# Patient Record
Sex: Female | Born: 1945
Health system: Southern US, Community
[De-identification: ages and names within clinical notes are randomized; demographics above are authoritative.]

## PROBLEM LIST (undated history)

## (undated) DIAGNOSIS — I10 Essential (primary) hypertension: Secondary | ICD-10-CM

## (undated) DIAGNOSIS — E785 Hyperlipidemia, unspecified: Secondary | ICD-10-CM

## (undated) DIAGNOSIS — E119 Type 2 diabetes mellitus without complications: Secondary | ICD-10-CM

## (undated) HISTORY — PX: CHOLECYSTECTOMY: SHX55

## (undated) HISTORY — DX: Type 2 diabetes mellitus without complications: E11.9

## (undated) HISTORY — DX: Essential (primary) hypertension: I10

## (undated) HISTORY — DX: Hyperlipidemia, unspecified: E78.5

---

## 1998-08-27 ENCOUNTER — Ambulatory Visit (HOSPITAL_COMMUNITY): Admission: RE | Admit: 1998-08-27 | Discharge: 1998-08-27 | Payer: Self-pay | Admitting: Obstetrics & Gynecology

## 1999-04-13 ENCOUNTER — Ambulatory Visit: Admission: RE | Admit: 1999-04-13 | Discharge: 1999-04-13 | Payer: Self-pay | Admitting: Family Medicine

## 2000-05-15 ENCOUNTER — Other Ambulatory Visit: Admission: RE | Admit: 2000-05-15 | Discharge: 2000-05-15 | Payer: Self-pay | Admitting: Obstetrics & Gynecology

## 2000-05-21 ENCOUNTER — Other Ambulatory Visit: Admission: RE | Admit: 2000-05-21 | Discharge: 2000-05-21 | Payer: Self-pay | Admitting: Obstetrics & Gynecology

## 2000-05-21 ENCOUNTER — Encounter (INDEPENDENT_AMBULATORY_CARE_PROVIDER_SITE_OTHER): Payer: Self-pay

## 2000-07-07 ENCOUNTER — Encounter: Admission: RE | Admit: 2000-07-07 | Discharge: 2000-07-07 | Payer: Self-pay | Admitting: Obstetrics & Gynecology

## 2000-07-07 ENCOUNTER — Encounter: Payer: Self-pay | Admitting: Obstetrics & Gynecology

## 2001-05-17 ENCOUNTER — Other Ambulatory Visit: Admission: RE | Admit: 2001-05-17 | Discharge: 2001-05-17 | Payer: Self-pay | Admitting: Obstetrics and Gynecology

## 2001-06-12 ENCOUNTER — Emergency Department (HOSPITAL_COMMUNITY): Admission: EM | Admit: 2001-06-12 | Discharge: 2001-06-12 | Payer: Self-pay

## 2001-07-12 ENCOUNTER — Encounter: Payer: Self-pay | Admitting: Obstetrics and Gynecology

## 2001-07-12 ENCOUNTER — Encounter: Admission: RE | Admit: 2001-07-12 | Discharge: 2001-07-12 | Payer: Self-pay | Admitting: Obstetrics and Gynecology

## 2001-07-16 ENCOUNTER — Encounter: Admission: RE | Admit: 2001-07-16 | Discharge: 2001-07-16 | Payer: Self-pay | Admitting: Obstetrics and Gynecology

## 2001-07-16 ENCOUNTER — Encounter: Payer: Self-pay | Admitting: Obstetrics and Gynecology

## 2001-07-30 ENCOUNTER — Observation Stay (HOSPITAL_COMMUNITY): Admission: RE | Admit: 2001-07-30 | Discharge: 2001-07-31 | Payer: Self-pay | Admitting: Surgery

## 2001-07-30 ENCOUNTER — Encounter: Payer: Self-pay | Admitting: Surgery

## 2001-07-30 ENCOUNTER — Encounter (INDEPENDENT_AMBULATORY_CARE_PROVIDER_SITE_OTHER): Payer: Self-pay | Admitting: *Deleted

## 2002-01-11 ENCOUNTER — Encounter: Admission: RE | Admit: 2002-01-11 | Discharge: 2002-01-11 | Payer: Self-pay | Admitting: Obstetrics and Gynecology

## 2002-01-11 ENCOUNTER — Encounter: Payer: Self-pay | Admitting: Obstetrics and Gynecology

## 2002-05-17 ENCOUNTER — Other Ambulatory Visit: Admission: RE | Admit: 2002-05-17 | Discharge: 2002-05-17 | Payer: Self-pay | Admitting: Obstetrics and Gynecology

## 2002-08-02 ENCOUNTER — Encounter: Admission: RE | Admit: 2002-08-02 | Discharge: 2002-08-02 | Payer: Self-pay | Admitting: Obstetrics and Gynecology

## 2002-08-02 ENCOUNTER — Encounter: Payer: Self-pay | Admitting: Obstetrics and Gynecology

## 2003-05-10 ENCOUNTER — Other Ambulatory Visit: Admission: RE | Admit: 2003-05-10 | Discharge: 2003-05-10 | Payer: Self-pay | Admitting: Obstetrics and Gynecology

## 2003-08-01 ENCOUNTER — Encounter (INDEPENDENT_AMBULATORY_CARE_PROVIDER_SITE_OTHER): Payer: Self-pay | Admitting: Specialist

## 2003-08-01 ENCOUNTER — Ambulatory Visit (HOSPITAL_COMMUNITY): Admission: RE | Admit: 2003-08-01 | Discharge: 2003-08-01 | Payer: Self-pay | Admitting: Gastroenterology

## 2003-08-16 ENCOUNTER — Encounter: Admission: RE | Admit: 2003-08-16 | Discharge: 2003-08-16 | Payer: Self-pay | Admitting: Obstetrics and Gynecology

## 2003-08-16 ENCOUNTER — Encounter: Payer: Self-pay | Admitting: Obstetrics and Gynecology

## 2004-08-21 ENCOUNTER — Other Ambulatory Visit: Admission: RE | Admit: 2004-08-21 | Discharge: 2004-08-21 | Payer: Self-pay | Admitting: Obstetrics and Gynecology

## 2005-01-27 ENCOUNTER — Encounter: Admission: RE | Admit: 2005-01-27 | Discharge: 2005-01-27 | Payer: Self-pay | Admitting: Family Medicine

## 2005-08-29 ENCOUNTER — Other Ambulatory Visit: Admission: RE | Admit: 2005-08-29 | Discharge: 2005-08-29 | Payer: Self-pay | Admitting: Obstetrics and Gynecology

## 2005-09-09 ENCOUNTER — Encounter: Admission: RE | Admit: 2005-09-09 | Discharge: 2005-09-09 | Payer: Self-pay | Admitting: Obstetrics and Gynecology

## 2006-01-28 ENCOUNTER — Encounter: Admission: RE | Admit: 2006-01-28 | Discharge: 2006-01-28 | Payer: Self-pay | Admitting: Family Medicine

## 2008-04-21 ENCOUNTER — Encounter: Admission: RE | Admit: 2008-04-21 | Discharge: 2008-04-21 | Payer: Self-pay | Admitting: Family Medicine

## 2009-01-04 ENCOUNTER — Encounter: Admission: RE | Admit: 2009-01-04 | Discharge: 2009-01-04 | Payer: Self-pay | Admitting: Family Medicine

## 2009-02-06 ENCOUNTER — Encounter (INDEPENDENT_AMBULATORY_CARE_PROVIDER_SITE_OTHER): Payer: Self-pay | Admitting: Interventional Radiology

## 2009-02-06 ENCOUNTER — Other Ambulatory Visit: Admission: RE | Admit: 2009-02-06 | Discharge: 2009-02-06 | Payer: Self-pay | Admitting: Interventional Radiology

## 2009-02-06 ENCOUNTER — Encounter: Admission: RE | Admit: 2009-02-06 | Discharge: 2009-02-06 | Payer: Self-pay | Admitting: Internal Medicine

## 2010-02-07 ENCOUNTER — Encounter: Admission: RE | Admit: 2010-02-07 | Discharge: 2010-02-07 | Payer: Self-pay | Admitting: Internal Medicine

## 2010-12-28 ENCOUNTER — Encounter: Payer: Self-pay | Admitting: Obstetrics and Gynecology

## 2011-04-25 NOTE — Op Note (Signed)
[place]  Patient:    Tracy [name], Tracy [name] Visit Number: [id number] MRN: [id number]          Service Type: SUR Location: [id number] Attending Physician:  Andre Lefort Dictated by:   Sandria Bales. Ezzard Standing, M.D. Proc. Date: 07/30/01 Adm. Date:  07/30/2001 Disc. Date: 07/31/2001   CC:         Desma Maxim, M.D.   Operative Report  DATE OF BIRTH:  [birth year].  CCS NUMBER:  [id number].  PREOPERATIVE DIAGNOSIS:  Chronic cholecystitis, cholelithiasis.  POSTOPERATIVE DIAGNOSIS:  Chronic cholecystitis, cholelithiasis.  OPERATION:  Laparoscopic cholecystectomy with intraoperative cholangiogram.  SURGEON:  Sandria Bales. Ezzard Standing, M.D.  ASSISTANT:  Donnie Coffin. Samuella Cota, M.D.  ANESTHESIA:  General endotracheal.  ESTIMATED BLOOD LOSS:  Minimal.  INDICATION:  Ms. Briggs is a 65 year old female who has symptomatic cholelithiasis.  Now comes for attempted laparoscopic cholecystectomy and potential indications and complications of procedure explained to the patient.  DESCRIPTION OF PROCEDURE:  The patient placed in supine position and given a general endotracheal anesthetic.  She had an orogastric tube, PS stockings in place, and given 1 gm of Ancef at the initiation of the procedure.  Her abdomen was prepped with Betadine solution and sterilely draped.  An infraumbilical incision was made with sharp dissection and carried down to the abdominal cavity.  A 0 degree laparoscope 10 mm was inserted through a 12 mm Hasson trocar.  The Hasson trocar was secured with a 0 Vicryl suture.  The abdomen was insufflated with CO2 to the pressure of about 14 cm.  Abdominal exploration revealed normal right and left local liver and anterior wall of the stomach.  The remainder of the bowel was covered with omentum. There was no nodularity, no mass, and no lesion.  Three additional trocars were placed-a 10 mm Ethicon trocar in the subxiphoid location and a 5 mm Ethicon  trocar in the right midsubcostal, and a 5 mm Ethicon trocar in the right lateral subcostal location.  The gallbladder was grasped, rotated cephalad.  Dissection carried out identifying the cystic duct, the gallbladder junction, and the cystic node of the triangle of Calot.  The cystic artery was triply endoclipped and divided. A single clip placed on the gallbladder side of the cystic duct. Intraoperative cholangiogram was obtained.  The intraoperative cholangiogram was obtained using a taut catheter inserted through a 14 gauge Jelco and inserted to the side of the cystic duct and a single clip to hold the duct in place.  I used half strength Hypaque solution. A total of about 12 cc was used under direct fluoroscopy.  The contrast looked freely down the cystic duct to the common bile duct of the hepatic radicles but did not empty into the common bile duct.  The distal common bile duct tapered normally.  I felt the patient just had spasm of the distal common bile duct.  I gave the patient 1 mg of glucagon, waited three minutes, and repeated the cholangiogram which now showed free flow of contrast into the duodenum.  This was felt to be a normal intraoperative cholangiogram.  The taut catheter was then removed.  The cystic duct was triply endoclipped and divided.  The gallbladder was then sharply and bluntly dissected from the gallbladder bed using primarily hook Bovie coagulation prior to complete division for the gallbladder and the gallbladder bed.  The gallbladder bed and triangle of Calot was visualized.  There was no bile leak and no bleeding.  The gallbladder was then divided, delivered through the umbilicus, and sent to pathology.  The umbilical port was closed with a 0 Vicryl suture.  The abdomen was irrigated.  All the ports were removed under direct visualization.  There was no bleeding and no bile leak.  The skin was then closed with a 5-0 Vicryl suture, painted with tinc  of benzoin and Steri-Strips, and sterilely dressed.  The patient was transferred to the recovery room in good condition with sponge and needle count. Dictated by:   Sandria Bales. Ezzard Standing, M.D. Attending Physician:  Andre Lefort DD:  07/30/01 TD:  08/01/01 Job: [id number]

## 2011-04-25 NOTE — Op Note (Signed)
   NAME:  [name], Tracy P                          ACCOUNT NO.:  [id number]   MEDICAL RECORD NO.:  [id number]                   PATIENT TYPE:  AMB   LOCATION:  ENDO                                 FACILITY[facility]  [facility]   PHYSICIAN:  John C. Madilyn Fireman, M.D.                 DATE OF BIRTH:  1946-07-21   DATE OF PROCEDURE:  08/01/2003  DATE OF DISCHARGE:                                 OPERATIVE REPORT   PROCEDURE:  Colonoscopy.   INDICATIONS FOR PROCEDURE:  Colon cancer screening in a 65 year old patient  with no previous screening.   DESCRIPTION OF PROCEDURE:  The patient was placed in the left lateral  decubitus position and placed on the pulse monitor, with continuous low-flow  oxygen delivered by nasal cannula.  She was sedated with 50 mcg IV fentanyl  and  4 mg IV Versed.  The Olympus video colonoscope was inserted into the rectum  and advanced to the cecum, confirmed by transillumination of McBurney's  point and visualization of the ileocecal valve and appendiceal orifice.  The  prep was excellent.  The cecum, ascending, and proximal transverse colon  appeared normal; with no masses, polyps, diverticula or other mucosal  abnormalities.  In the distal transverse colon there was a 6 cm sessile  polyp, which was fulgurated by hot biopsy.  The descending, sigmoid and  rectum appeared normal, with no further polyps, masses, diverticula or other  mucosal abnormalities.  The scope was then withdrawn and the patient  returned to the recovery room in stable condition.  She tolerated the  procedure well and there were no immediate complications.   IMPRESSION:  1. Transverse colon polyp.  2. Otherwise normal study.   PLAN:  Await histology to determine method and interval for future colon  screening.                                               John C. Madilyn Fireman, M.D.    JCH/MEDQ  D:  08/01/2003  T:  08/01/2003  Job:  [id number]   cc:   Donia Guiles, M.D.  [address] [place]  [place] [address]  Fax: [phone]

## 2013-07-07 ENCOUNTER — Encounter: Payer: Medicare Other | Attending: Family Medicine

## 2013-07-07 VITALS — Ht 63.0 in | Wt 193.1 lb

## 2013-07-07 DIAGNOSIS — Z713 Dietary counseling and surveillance: Secondary | ICD-10-CM | POA: Insufficient documentation

## 2013-07-07 DIAGNOSIS — E119 Type 2 diabetes mellitus without complications: Secondary | ICD-10-CM | POA: Insufficient documentation

## 2013-07-07 NOTE — Progress Notes (Signed)
Patient was seen on 07/07/13 for the first of a series of three diabetes self-management courses at the [facility].   Current HbA1c: 6.5%  The following learning objectives were met by the patient during this course:   Defines the role of glucose and insulin  Identifies type of diabetes and pathophysiology  Defines the diagnostic criteria for diabetes and prediabetes  States the risk factors for Type 2 Diabetes  States the symptoms of Type 2 Diabetes  Defines Type 2 Diabetes treatment goals  Defines Type 2 Diabetes treatment options  States the rationale for glucose monitoring  Identifies A1C, glucose targets, and testing times  Identifies proper sharps disposal  Defines the purpose of a diabetes food plan  Identifies carbohydrate food groups  Defines effects of carbohydrate foods on glucose levels  Identifies carbohydrate choices/grams/food labels  States benefits of physical activity and effect on glucose  Review of suggested activity guidelines  Handouts given during class include:  Type 2 Diabetes: Basics Book  My Food Plan Book  Food and Activity Log  Your patient has identified their diabetes self-care support plan as:  [facility] support group  Follow-Up Plan: Attend core 2 and core 3

## 2013-07-07 NOTE — Patient Instructions (Signed)
Goals:  Follow Diabetes Meal Plan as instructed  Eat 3 meals and 2 snacks, every 3-5 hrs  Limit carbohydrate intake to 30-45 grams carbohydrate/meal  Limit carbohydrate intake to 15 grams carbohydrate/snack  Add lean protein foods to meals/snacks  Monitor glucose levels as instructed by your doctor  Aim for 30 mins of physical activity daily  Bring food record and glucose log to your next nutrition visit 

## 2013-07-28 ENCOUNTER — Ambulatory Visit: Payer: Medicare Other

## 2013-08-11 ENCOUNTER — Ambulatory Visit: Payer: Medicare Other

## 2013-08-25 ENCOUNTER — Encounter: Payer: Medicare Other | Attending: Family Medicine

## 2013-08-25 DIAGNOSIS — E119 Type 2 diabetes mellitus without complications: Secondary | ICD-10-CM | POA: Insufficient documentation

## 2013-08-25 DIAGNOSIS — Z713 Dietary counseling and surveillance: Secondary | ICD-10-CM | POA: Insufficient documentation

## 2013-08-25 NOTE — Progress Notes (Signed)
Patient was seen on 08/25/13 for the second of a series of three diabetes self-management courses at the [facility]. The following learning objectives were met by the patient during this course:   Explain basic nutrition maintenance and quality assurance  Describe causes, symptoms and treatment of hypoglycemia and hyperglycemia  Explain how to manage diabetes during illness  Describe the importance of good nutrition for health and healthy eating strategies  List strategies to follow meal plan when dining out  Describe the effects of alcohol on glucose and how to use it safely  Describe problem solving skills for day-to-day glucose challenges  Describe strategies to use when treatment plan needs to change  Identify important factors involved in successful weight loss  Describe ways to remain physically active  Describe the impact of regular activity on insulin resistance  Identify current diabetes medications, their action on blood glucose, and [pssible side effects.  Handouts given in class:  Refrigerator magnet for Sick Day Guidelines  [facility] Oral medication/insulin handout  Your patient has identified their diabetes self-care support plan as:  [facility] support group   Follow-Up Plan: Patient will attend the final class of the ADA Diabetes Self-Care Education.   

## 2013-09-22 ENCOUNTER — Encounter: Payer: Medicare Other | Attending: Family Medicine

## 2013-09-22 DIAGNOSIS — E119 Type 2 diabetes mellitus without complications: Secondary | ICD-10-CM | POA: Insufficient documentation

## 2013-09-22 DIAGNOSIS — Z713 Dietary counseling and surveillance: Secondary | ICD-10-CM | POA: Insufficient documentation

## 2013-09-30 NOTE — Progress Notes (Signed)
  Patient was seen on 09/22/13 for the third of a series of three diabetes self-management courses at the [facility]. The following learning objectives were met by the patient during this course:    Describe how diabetes changes over time   Identify diabetes complications and ways to prevent them   Describe strategies that can promote heart health including lowering blood pressure and cholesterol   Describe strategies to lower dietary fat and sodium in the diet   Identify physical activities that benefit cardiovascular health   Evaluate success in meeting personal goal   Describe the belief that they can live successfully with diabetes day to day   Establish 2-3 goals that they will plan to diligently work on until they return for the free 73-month follow-up visit  The following handouts were given in class:  4 Month Follow Up Visit handout  Goal setting handout  Class evaluation form  Your patient has established the following 3month goals for diabetes self-care: Count carbs at most meals Read food labels Be active 30 minutes 5Xwk Test glucose 1Xdaily 7days a week  Follow-Up Plan: Patient will attend a 4 month follow-up visit for diabetes self-management education.

## 2014-01-27 ENCOUNTER — Ambulatory Visit: Payer: Medicare Other | Admitting: *Deleted

## 2014-02-14 ENCOUNTER — Ambulatory Visit: Payer: Medicare Other | Admitting: *Deleted

## 2014-03-07 ENCOUNTER — Ambulatory Visit: Payer: Medicare Other | Admitting: *Deleted

## 2014-06-27 ENCOUNTER — Other Ambulatory Visit: Payer: Self-pay | Admitting: Family Medicine

## 2014-06-27 DIAGNOSIS — Z1231 Encounter for screening mammogram for malignant neoplasm of breast: Secondary | ICD-10-CM

## 2014-07-06 ENCOUNTER — Other Ambulatory Visit: Payer: Self-pay | Admitting: Family Medicine

## 2014-07-06 ENCOUNTER — Ambulatory Visit
Admission: RE | Admit: 2014-07-06 | Discharge: 2014-07-06 | Disposition: A | Discharge status: Home / Self Care | Payer: Medicare Other | Source: Ambulatory Visit | Attending: Family Medicine | Admitting: Family Medicine

## 2014-07-06 ENCOUNTER — Encounter (INDEPENDENT_AMBULATORY_CARE_PROVIDER_SITE_OTHER): Payer: Self-pay

## 2014-07-06 DIAGNOSIS — R928 Other abnormal and inconclusive findings on diagnostic imaging of breast: Secondary | ICD-10-CM

## 2014-07-06 DIAGNOSIS — Z1231 Encounter for screening mammogram for malignant neoplasm of breast: Secondary | ICD-10-CM

## 2014-07-13 ENCOUNTER — Ambulatory Visit
Admission: RE | Admit: 2014-07-13 | Discharge: 2014-07-13 | Disposition: A | Discharge status: Home / Self Care | Payer: Medicare Other | Source: Ambulatory Visit | Attending: Family Medicine | Admitting: Family Medicine

## 2014-07-13 DIAGNOSIS — R928 Other abnormal and inconclusive findings on diagnostic imaging of breast: Secondary | ICD-10-CM

## 2014-09-09 ENCOUNTER — Emergency Department (HOSPITAL_COMMUNITY): Payer: Medicare Other

## 2014-09-09 ENCOUNTER — Observation Stay (HOSPITAL_COMMUNITY): Payer: Medicare Other

## 2014-09-09 ENCOUNTER — Encounter (HOSPITAL_COMMUNITY): Payer: Self-pay | Admitting: Emergency Medicine

## 2014-09-09 ENCOUNTER — Observation Stay (HOSPITAL_COMMUNITY)
Admission: EM | Admit: 2014-09-09 | Discharge: 2014-09-10 | Disposition: A | Discharge status: Home / Self Care | Payer: Medicare Other | Attending: Family Medicine | Admitting: Family Medicine

## 2014-09-09 DIAGNOSIS — N179 Acute kidney failure, unspecified: Secondary | ICD-10-CM | POA: Insufficient documentation

## 2014-09-09 DIAGNOSIS — R4781 Slurred speech: Secondary | ICD-10-CM | POA: Diagnosis not present

## 2014-09-09 DIAGNOSIS — I6782 Cerebral ischemia: Secondary | ICD-10-CM | POA: Insufficient documentation

## 2014-09-09 DIAGNOSIS — T68XXXA Hypothermia, initial encounter: Secondary | ICD-10-CM | POA: Diagnosis not present

## 2014-09-09 DIAGNOSIS — E119 Type 2 diabetes mellitus without complications: Secondary | ICD-10-CM | POA: Diagnosis not present

## 2014-09-09 DIAGNOSIS — Z79899 Other long term (current) drug therapy: Secondary | ICD-10-CM | POA: Diagnosis not present

## 2014-09-09 DIAGNOSIS — I10 Essential (primary) hypertension: Secondary | ICD-10-CM | POA: Insufficient documentation

## 2014-09-09 DIAGNOSIS — R260 Ataxic gait: Secondary | ICD-10-CM | POA: Diagnosis not present

## 2014-09-09 DIAGNOSIS — X58XXXA Exposure to other specified factors, initial encounter: Secondary | ICD-10-CM | POA: Diagnosis not present

## 2014-09-09 DIAGNOSIS — I959 Hypotension, unspecified: Principal | ICD-10-CM | POA: Diagnosis present

## 2014-09-09 DIAGNOSIS — E785 Hyperlipidemia, unspecified: Secondary | ICD-10-CM | POA: Insufficient documentation

## 2014-09-09 DIAGNOSIS — D649 Anemia, unspecified: Secondary | ICD-10-CM | POA: Diagnosis present

## 2014-09-09 LAB — URINALYSIS, ROUTINE W REFLEX MICROSCOPIC
BILIRUBIN URINE: NEGATIVE
GLUCOSE, UA: NEGATIVE mg/dL
HGB URINE DIPSTICK: NEGATIVE
KETONES UR: NEGATIVE mg/dL
Nitrite: NEGATIVE
PROTEIN: NEGATIVE mg/dL
Specific Gravity, Urine: 1.007 (ref 1.005–1.030)
UROBILINOGEN UA: 0.2 mg/dL (ref 0.0–1.0)
pH: 5 (ref 5.0–8.0)

## 2014-09-09 LAB — COMPREHENSIVE METABOLIC PANEL
ALK PHOS: 91 U/L (ref 39–117)
ALT: 14 U/L (ref 0–35)
ANION GAP: 13 (ref 5–15)
AST: 13 U/L (ref 0–37)
Albumin: 3.5 g/dL (ref 3.5–5.2)
BILIRUBIN TOTAL: 0.3 mg/dL (ref 0.3–1.2)
BUN: 25 mg/dL — AB (ref 6–23)
CHLORIDE: 101 meq/L (ref 96–112)
CO2: 24 mEq/L (ref 19–32)
CREATININE: 1.17 mg/dL — AB (ref 0.50–1.10)
Calcium: 8.5 mg/dL (ref 8.4–10.5)
GFR calc non Af Amer: 47 mL/min — ABNORMAL LOW (ref >90)
GFR, EST AFRICAN AMERICAN: 54 mL/min — AB (ref >90)
GLUCOSE: 118 mg/dL — AB (ref 70–99)
POTASSIUM: 3.8 meq/L (ref 3.7–5.3)
Sodium: 138 mEq/L (ref 137–147)
Total Protein: 6.6 g/dL (ref 6.0–8.3)

## 2014-09-09 LAB — CBC WITH DIFFERENTIAL/PLATELET
BASOS PCT: 1 % (ref 0–1)
Basophils Absolute: 0.1 10*3/uL (ref 0.0–0.1)
EOS ABS: 0.3 10*3/uL (ref 0.0–0.7)
Eosinophils Relative: 5 % (ref 0–5)
HEMATOCRIT: 30.6 % — AB (ref 36.0–46.0)
HEMOGLOBIN: 9.5 g/dL — AB (ref 12.0–15.0)
LYMPHS ABS: 1.5 10*3/uL (ref 0.7–4.0)
Lymphocytes Relative: 23 % (ref 12–46)
MCH: 25 pg — AB (ref 26.0–34.0)
MCHC: 31 g/dL (ref 30.0–36.0)
MCV: 80.5 fL (ref 78.0–100.0)
MONO ABS: 0.5 10*3/uL (ref 0.1–1.0)
MONOS PCT: 8 % (ref 3–12)
NEUTROS PCT: 63 % (ref 43–77)
Neutro Abs: 4.1 10*3/uL (ref 1.7–7.7)
Platelets: 272 10*3/uL (ref 150–400)
RBC: 3.8 MIL/uL — AB (ref 3.87–5.11)
RDW: 16.9 % — ABNORMAL HIGH (ref 11.5–15.5)
WBC: 6.5 10*3/uL (ref 4.0–10.5)

## 2014-09-09 LAB — RAPID URINE DRUG SCREEN, HOSP PERFORMED
Amphetamines: NOT DETECTED
Barbiturates: NOT DETECTED
Benzodiazepines: NOT DETECTED
COCAINE: NOT DETECTED
OPIATES: NOT DETECTED
TETRAHYDROCANNABINOL: NOT DETECTED

## 2014-09-09 LAB — I-STAT CHEM 8, ED
BUN: 26 mg/dL — ABNORMAL HIGH (ref 6–23)
Calcium, Ion: 1.11 mmol/L — ABNORMAL LOW (ref 1.13–1.30)
Chloride: 104 mEq/L (ref 96–112)
Creatinine, Ser: 1.3 mg/dL — ABNORMAL HIGH (ref 0.50–1.10)
Glucose, Bld: 121 mg/dL — ABNORMAL HIGH (ref 70–99)
HCT: 31 % — ABNORMAL LOW (ref 36.0–46.0)
HEMOGLOBIN: 10.5 g/dL — AB (ref 12.0–15.0)
POTASSIUM: 3.8 meq/L (ref 3.7–5.3)
Sodium: 138 mEq/L (ref 137–147)
TCO2: 24 mmol/L (ref 0–100)

## 2014-09-09 LAB — URINE MICROSCOPIC-ADD ON

## 2014-09-09 LAB — CBG MONITORING, ED: Glucose-Capillary: 121 mg/dL — ABNORMAL HIGH (ref 70–99)

## 2014-09-09 LAB — I-STAT CG4 LACTIC ACID, ED: LACTIC ACID, VENOUS: 1.78 mmol/L (ref 0.5–2.2)

## 2014-09-09 LAB — TROPONIN I: Troponin I: <0.3 ng/mL (ref <0.30)

## 2014-09-09 MED ORDER — SODIUM CHLORIDE 0.9 % IV SOLN
Freq: Once | INTRAVENOUS | Status: AC
  Administered 2014-09-09: 13:00:00 via INTRAVENOUS

## 2014-09-09 MED ORDER — VENLAFAXINE HCL ER 75 MG PO CP24
75.0000 mg | ORAL_CAPSULE | Freq: Every day | ORAL | Status: DC
  Administered 2014-09-10: 75 mg via ORAL
  Filled 2014-09-09: qty 1

## 2014-09-09 MED ORDER — SODIUM CHLORIDE 0.9 % IV BOLUS (SEPSIS)
2000.0000 mL | Freq: Once | INTRAVENOUS | Status: AC
  Administered 2014-09-09: 2000 mL via INTRAVENOUS

## 2014-09-09 MED ORDER — ZOLPIDEM TARTRATE 5 MG PO TABS
5.0000 mg | ORAL_TABLET | Freq: Every day | ORAL | Status: DC
  Administered 2014-09-09: 5 mg via ORAL
  Filled 2014-09-09: qty 1

## 2014-09-09 MED ORDER — SODIUM CHLORIDE 0.9 % IV SOLN: INTRAVENOUS | Status: DC

## 2014-09-09 MED ORDER — ATORVASTATIN CALCIUM 10 MG PO TABS
20.0000 mg | ORAL_TABLET | Freq: Every day | ORAL | Status: DC
  Administered 2014-09-10: 20 mg via ORAL
  Filled 2014-09-09: qty 2

## 2014-09-09 MED ORDER — ENOXAPARIN SODIUM 40 MG/0.4ML ~~LOC~~ SOLN
40.0000 mg | SUBCUTANEOUS | Status: DC
Start: 2014-09-09 — End: 2014-09-10
  Administered 2014-09-09: 40 mg via SUBCUTANEOUS
  Filled 2014-09-09: qty 0.4

## 2014-09-09 MED ORDER — PANTOPRAZOLE SODIUM 40 MG PO TBEC
40.0000 mg | DELAYED_RELEASE_TABLET | Freq: Every day | ORAL | Status: DC
  Administered 2014-09-10: 40 mg via ORAL
  Filled 2014-09-09: qty 1

## 2014-09-09 NOTE — ED Notes (Addendum)
Pt to department via EMS- pt reports that she was at the church this morning and started feeling drowsy. Also had some slurred speech, but unsure of the LSN. Pt A&Ox4 on arrival. No neuro deficits. Bp- 60/40- 110/64 HR-62 99-3l CBG-168 18left wrist 500cc NS

## 2014-09-09 NOTE — ED Notes (Signed)
Report attempted 

## 2014-09-09 NOTE — ED Notes (Signed)
Pt given BSC to use for urine sample.

## 2014-09-09 NOTE — ED Notes (Signed)
Patient transported to MRI 

## 2014-09-09 NOTE — Progress Notes (Signed)
Patient arrived from ED. Oriented to unit and room. Tele applied. IV fluids started.

## 2014-09-09 NOTE — ED Provider Notes (Signed)
CSN: [id number]     Arrival date & time 09/09/14  0272 History   First MD Initiated Contact with Patient 09/09/14 [phone]     Chief Complaint  Patient presents with  . Hypotension     (Consider location/radiation/quality/duration/timing/severity/associated sxs/prior Treatment) HPI 68 year old female brought in by EMS after slurred speech and fatigue that started this morning. She states approximately 2 hours ago, at 7 AM shows that she was having trouble speaking and felt very drowsy. EMS was called and noticed her blood pressure to be 60/40. They gave her a fluid bolus of 500 cc her blood pressure came up to 110/64. She still having slurred speech. Patient was at a yard sale when this started and states she woke up normally. Her husband or so she's been working at [employer] a lot this week and has not been drinking as much water is normal. The patient denies feeling dizzy but feels very fatigued. Denies any current headaches, vomiting, diarrhea, cough, shortness of breath, chest pain, abdominal pain, or urinary symptoms. 2 days ago she did wake up with a headache that spontaneously resolved. No focal weakness or numbness per the patient. Patient remarks that she took her blood pressure medicines like normal this morning and does not think there is a chance she took extra by accident. She feels like she's having a hard time keeping her eyes open.  Past Medical History  Diagnosis Date  . Diabetes mellitus without complication   . Hyperlipidemia   . Hypertension    Past Surgical History  Procedure Laterality Date  . Cholecystectomy     Family History  Problem Relation Age of Onset  . Hyperlipidemia Other   . Hypertension Other   . Stroke Other    History  Substance Use Topics  . Smoking status: Never Smoker   . Smokeless tobacco: Not on file  . Alcohol Use: Not on file   OB History   Grav Para Term Preterm Abortions TAB SAB Ect Mult Living                 Review of Systems   Constitutional: Positive for fatigue. Negative for fever.  Respiratory: Negative for cough and shortness of breath.   Cardiovascular: Negative for chest pain.  Gastrointestinal: Negative for vomiting, abdominal pain and diarrhea.  Genitourinary: Negative for dysuria.  Neurological: Negative for light-headedness.  All other systems reviewed and are negative.     Allergies  Review of patient's allergies indicates no known allergies.  Home Medications   Prior to Admission medications   Medication Sig Start Date End Date Taking? Authorizing Provider  atorvastatin (LIPITOR) 20 MG tablet Take 20 mg by mouth daily.    Historical Provider, MD  Calcium 150 MG TABS Take by mouth 2 (two) times daily.    Historical Provider, MD  cholecalciferol (VITAMIN D) 1000 UNITS tablet Take 1,000 Units by mouth daily.    Historical Provider, MD  esomeprazole (NEXIUM) 40 MG capsule Take 40 mg by mouth daily before breakfast.    Historical Provider, MD  fish oil-omega-3 fatty acids 1000 MG capsule Take 2 g by mouth daily.    Historical Provider, MD  metoprolol succinate (TOPROL-XL) 50 MG 24 hr tablet Take 50 mg by mouth daily. Take with or immediately following a meal.    Historical Provider, MD  Multiple Vitamins-Minerals (ICAPS PO) Take by mouth.    Historical Provider, MD  olmesartan-hydrochlorothiazide (BENICAR HCT) 20-12.5 MG per tablet Take 1 tablet by mouth daily.  Historical Provider, MD  venlafaxine XR (EFFEXOR-XR) 75 MG 24 hr capsule Take 75 mg by mouth daily.    Historical Provider, MD   BP 80/31  Pulse 65  Temp(Src) 97.4 F (36.3 C) (Oral)  Resp 17  Ht 5\' 5"  (1.651 m)  Wt 193 lb (87.544 kg)  BMI 32.12 kg/m2  SpO2 94% Physical Exam  Nursing note and vitals reviewed. Constitutional: She is oriented to person, place, and time. She appears well-developed and well-nourished. No distress.  Appears sleepy but opens eyes to voice and follows all commands  HENT:  Head: Normocephalic and  atraumatic.  Right Ear: External ear normal.  Left Ear: External ear normal.  Nose: Nose normal.  Very dry tongue/oropharynx  Eyes: EOM are normal. Pupils are equal, round, and reactive to light. Right eye exhibits no discharge. Left eye exhibits no discharge.  Normal visual fields  Cardiovascular: Normal rate, regular rhythm and normal heart sounds.   Pulmonary/Chest: Effort normal and breath sounds normal.  Abdominal: Soft. There is no tenderness.  Neurological: She is alert and oriented to person, place, and time.  CN 2-12 grossly intact. 5/5 strength in all 4 extremities  Skin: Skin is warm and dry.  Psychiatric: Her speech is slurred.    ED Course  Procedures (including critical care time) Labs Review Labs Reviewed  CBC WITH DIFFERENTIAL - Abnormal; Notable for the following:    RBC 3.80 (*)    Hemoglobin 9.5 (*)    HCT 30.6 (*)    MCH 25.0 (*)    RDW 16.9 (*)    All other components within normal limits  COMPREHENSIVE METABOLIC PANEL - Abnormal; Notable for the following:    Glucose, Bld 118 (*)    BUN 25 (*)    Creatinine, Ser 1.17 (*)    GFR calc non Af Amer 47 (*)    GFR calc Af Amer 54 (*)    All other components within normal limits  URINALYSIS, ROUTINE W REFLEX MICROSCOPIC - Abnormal; Notable for the following:    Leukocytes, UA TRACE (*)    All other components within normal limits  URINE MICROSCOPIC-ADD ON - Abnormal; Notable for the following:    Squamous Epithelial / LPF FEW (*)    Casts HYALINE CASTS (*)    All other components within normal limits  CBG MONITORING, ED - Abnormal; Notable for the following:    Glucose-Capillary 121 (*)    All other components within normal limits  I-STAT CHEM 8, ED - Abnormal; Notable for the following:    BUN 26 (*)    Creatinine, Ser 1.30 (*)    Glucose, Bld 121 (*)    Calcium, Ion 1.11 (*)    Hemoglobin 10.5 (*)    HCT 31.0 (*)    All other components within normal limits  TROPONIN I  URINE RAPID DRUG SCREEN  (HOSP PERFORMED)  I-STAT CG4 LACTIC ACID, ED    Imaging Review Dg Chest 2 View  09/09/2014   CLINICAL DATA:  HYPOTENSION, dizzy, had slurred speech, nausea today. Hx of HTN-on meds, DM- no meds,  EXAM: CHEST - 2 VIEW  COMPARISON:  09/09/2014  FINDINGS: Early hyperinflation with somewhat coarse bibasilar bronchovascular markings. No confluent airspace consolidation or overt edema. Heart size normal. No effusion. No pneumothorax. Surgical clips in the right upper abdomen. Visualized skeletal structures are unremarkable.  IMPRESSION: No acute cardiopulmonary disease.   Electronically Signed   By: Arne Cleveland M.D.   On: 09/09/2014 13:35   Ct  Head Wo Contrast  09/09/2014   CLINICAL DATA:  Patient began feeling drowsy at church this morning with associated slurred speech.  EXAM: CT HEAD WITHOUT CONTRAST  TECHNIQUE: Contiguous axial images were obtained from the base of the skull through the vertex without intravenous contrast.  COMPARISON:  None.  FINDINGS: There is no evidence of acute cortical infarct, intracranial hemorrhage, mass, midline shift, or extra-axial fluid collection. Ventricles and sulci are normal for age. Periventricular white-matter hypodensities are nonspecific but compatible with mild chronic small vessel ischemic disease.  Prior bilateral cataract extraction is noted. Visualized paranasal sinuses and mastoid air cells are clear. Mild carotid siphon calcification is noted.  IMPRESSION: 1. No evidence of acute intracranial abnormality. 2. Mild chronic small vessel ischemic disease.   Electronically Signed   By: Logan Bores   On: 09/09/2014 10:32   Dg Chest Portable 1 View  09/09/2014   CLINICAL DATA:  Hypotension. History of diabetes and hypertension. Initial encounter.  EXAM: PORTABLE CHEST - 1 VIEW  COMPARISON:  None.  FINDINGS: Normal cardiac silhouette and mediastinal contours. Ill-defined left basilar/retrocardiac heterogeneous opacities. No pleural effusion or pneumothorax. No  evidence of edema. No acute osseus abnormalities.  IMPRESSION: Ill-defined left basilar/retrocardiac heterogeneous opacities, possibly atelectasis though underlying infection not excluded. Further evaluation with a PA and lateral chest radiograph may be obtained as clinically indicated.   Electronically Signed   By: Sandi Mariscal M.D.   On: 09/09/2014 10:06     EKG Interpretation   Date/Time:  Saturday September 09 2014 08:57:32 EDT Ventricular Rate:  69 PR Interval:  128 QRS Duration: 94 QT Interval:  456 QTC Calculation: 489 R Axis:   52 Text Interpretation:  Sinus rhythm Borderline prolonged QT interval  Baseline wander in lead(s) V1 V3 No significant change since last tracing  Confirmed by Janssen Zee  MD, Evolette Pendell ([id number]) on 09/09/2014 12:37:38 PM      MDM   Final diagnoses:  Hypotension, unspecified hypotension type    Patient with hypotension of unspecified etiology. No signs of infection. No AMS. Her mouth is very dry and I believe this is causing her slurred speech. Did d/w Dr. Leonel Ramsay of neuro, and as patient has normal visual fields and no other neuro symptoms, stroke is unlikely and dehydration/hypotension is likely cause of slurred speech. BP improved with fluids but trended back down. Will keep giving fluids, and admit to hospitalist.    Ephraim Hamburger, MD 09/09/14 [phone]

## 2014-09-09 NOTE — H&P (Signed)
[facility] History and Physical  Tracy [name] SFK:[id number] DOB: 27-Mar-1946 DOA: 09/09/2014  Referring physician: EDP PCP: Mayra Neer, MD   Chief Complaint:   HPI: Tracy [name] is a 68 y.o. female with prior h/o hypertension, was brought in by EMS for confusion. As per the patient, she was busy with a yard sale and she hasn't been drinking fluids as usual, and felt very exhausted today. She also reported feeling dizzy, unsteady on her feet and seeing double vision all day today. Later on she went inside to rest and felt soo sleepy. The next moment she woke up, she was surrounded by people and was brought in by EMS . She also reported some slurred and thick speech . EMS found her to be hypotensive and hypothermic. On arrival to ED,s he received 2 liters of NS fluids nad her BP improved . She still had thick speech and felt unsteady and was hence referred to [place] for admission for observation. Her UA and CXR has been negative for infection.    Review of Systems:  See HPi otherwise negative.    Past Medical History  Diagnosis Date  . Diabetes mellitus without complication   . Hyperlipidemia   . Hypertension    Past Surgical History  Procedure Laterality Date  . Cholecystectomy     Social History:  reports that she has never smoked. She does not have any smokeless tobacco history on file. Her alcohol and drug histories are not on file.  No Known Allergies  Family History  Problem Relation Age of Onset  . Hyperlipidemia Other   . Hypertension Other   . Stroke Other      Prior to Admission medications   Medication Sig Start Date End Date Taking? Authorizing Provider  atorvastatin (LIPITOR) 20 MG tablet Take 20 mg by mouth daily.   Yes Historical Provider, MD  cholecalciferol (VITAMIN D) 1000 UNITS tablet Take 1,000 Units by mouth daily.   Yes Historical Provider, MD  esomeprazole (NEXIUM) 40 MG capsule Take 40 mg by mouth daily before breakfast.   Yes Historical  Provider, MD  fish oil-omega-3 fatty acids 1000 MG capsule Take 3 g by mouth daily.    Yes Historical Provider, MD  ibuprofen (ADVIL,MOTRIN) 200 MG tablet Take 600 mg by mouth every 6 (six) hours as needed for moderate pain.   Yes Historical Provider, MD  metoprolol succinate (TOPROL-XL) 50 MG 24 hr tablet Take 50 mg by mouth daily. Take with or immediately following a meal.   Yes Historical Provider, MD  olmesartan-hydrochlorothiazide (BENICAR HCT) 20-12.5 MG per tablet Take 1 tablet by mouth daily.   Yes Historical Provider, MD  venlafaxine XR (EFFEXOR-XR) 75 MG 24 hr capsule Take 75 mg by mouth daily.   Yes Historical Provider, MD  zolpidem (AMBIEN) 10 MG tablet Take 5 mg by mouth at bedtime. 08/15/14  Yes Historical Provider, MD   Physical Exam: Filed Vitals:   09/09/14 1607 09/09/14 1630 09/09/14 1700 09/09/14 1851  BP: 132/53 125/63 126/64 147/60  Pulse:  62 67 68  Temp:    98 F (36.7 C)  TempSrc:    Oral  Resp:    16  Height:      Weight:      SpO2: 100% 97% 100% 99%    Wt Readings from Last 3 Encounters:  09/09/14 87.544 kg (193 lb)  07/07/13 87.59 kg (193 lb 1.6 oz)    General:  Appears calm and comfortable Eyes: PERRL, normal lids, irises &  conjunctiva Neck: no LAD, masses or thyromegaly Cardiovascular: RRR, no m/r/g. No LE edema. Respiratory: CTA bilaterally, no w/r/r. Normal respiratory effort. Abdomen: soft, ntnd Skin: no rash or induration seen on limited exam Musculoskeletal: grossly normal tone BUE/BLE Neurologic: lethargic, able to move all extremities, no focal deficits.           Labs on Admission:  Basic Metabolic Panel:  Recent Labs Lab 09/09/14 0910 09/09/14 0919  NA 138 138  K 3.8 3.8  CL 101 104  CO2 24  --   GLUCOSE 118* 121*  BUN 25* 26*  CREATININE 1.17* 1.30*  CALCIUM 8.5  --    Liver Function Tests:  Recent Labs Lab 09/09/14 0910  AST 13  ALT 14  ALKPHOS 91  BILITOT 0.3  PROT 6.6  ALBUMIN 3.5   No results found for this  basename: LIPASE, AMYLASE,  in the last 168 hours No results found for this basename: AMMONIA,  in the last 168 hours CBC:  Recent Labs Lab 09/09/14 0910 09/09/14 0919  WBC 6.5  --   NEUTROABS 4.1  --   HGB 9.5* 10.5*  HCT 30.6* 31.0*  MCV 80.5  --   PLT 272  --    Cardiac Enzymes:  Recent Labs Lab 09/09/14 0910  TROPONINI <0.30    BNP (last 3 results) No results found for this basename: PROBNP,  in the last 8760 hours CBG:  Recent Labs Lab 09/09/14 0901  GLUCAP 121*    Radiological Exams on Admission: Dg Chest 2 View  09/09/2014   CLINICAL DATA:  HYPOTENSION, dizzy, had slurred speech, nausea today. Hx of HTN-on meds, DM- no meds,  EXAM: CHEST - 2 VIEW  COMPARISON:  09/09/2014  FINDINGS: Early hyperinflation with somewhat coarse bibasilar bronchovascular markings. No confluent airspace consolidation or overt edema. Heart size normal. No effusion. No pneumothorax. Surgical clips in the right upper abdomen. Visualized skeletal structures are unremarkable.  IMPRESSION: No acute cardiopulmonary disease.   Electronically Signed   By: Arne Cleveland M.D.   On: 09/09/2014 13:35   Ct Head Wo Contrast  09/09/2014   CLINICAL DATA:  Patient began feeling drowsy at church this morning with associated slurred speech.  EXAM: CT HEAD WITHOUT CONTRAST  TECHNIQUE: Contiguous axial images were obtained from the base of the skull through the vertex without intravenous contrast.  COMPARISON:  None.  FINDINGS: There is no evidence of acute cortical infarct, intracranial hemorrhage, mass, midline shift, or extra-axial fluid collection. Ventricles and sulci are normal for age. Periventricular white-matter hypodensities are nonspecific but compatible with mild chronic small vessel ischemic disease.  Prior bilateral cataract extraction is noted. Visualized paranasal sinuses and mastoid air cells are clear. Mild carotid siphon calcification is noted.  IMPRESSION: 1. No evidence of acute intracranial  abnormality. 2. Mild chronic small vessel ischemic disease.   Electronically Signed   By: Logan Bores   On: 09/09/2014 10:32   Mr Brain Wo Contrast  09/09/2014   CLINICAL DATA:  Altered mental status beginning earlier today while at church. Associated slurred speech. Stroke risk factors include diabetes and hypertension.  EXAM: MRI HEAD WITHOUT CONTRAST  TECHNIQUE: Multiplanar, multiecho pulse sequences of the brain and surrounding structures were obtained without intravenous contrast.  COMPARISON:  CT head earlier today.  FINDINGS: No evidence for acute infarction, hemorrhage, mass lesion, hydrocephalus, or extra-axial fluid. Normal for age cerebral volume. Mild to moderate T2 and FLAIR hyperintensities throughout the periventricular and subcortical white matter, also involving the brainstem,  likely chronic microvascular ischemic change. Flow voids are maintained throughout the carotid, basilar, and vertebral arteries. There are no areas of chronic hemorrhage. Pituitary, pineal, and cerebellar tonsils unremarkable. No upper cervical lesions. Visualized calvarium, skull base, and upper cervical osseous structures unremarkable. Scalp and extracranial soft tissues, orbits, sinuses, and mastoids show no acute process. BILATERAL cataract extraction.Good agreement with prior CT.  IMPRESSION: Chronic changes as described.  No acute intracranial findings.   Electronically Signed   By: Rolla Flatten M.D.   On: 09/09/2014 18:35   Dg Chest Portable 1 View  09/09/2014   CLINICAL DATA:  Hypotension. History of diabetes and hypertension. Initial encounter.  EXAM: PORTABLE CHEST - 1 VIEW  COMPARISON:  None.  FINDINGS: Normal cardiac silhouette and mediastinal contours. Ill-defined left basilar/retrocardiac heterogeneous opacities. No pleural effusion or pneumothorax. No evidence of edema. No acute osseus abnormalities.  IMPRESSION: Ill-defined left basilar/retrocardiac heterogeneous opacities, possibly atelectasis though  underlying infection not excluded. Further evaluation with a PA and lateral chest radiograph may be obtained as clinically indicated.   Electronically Signed   By: Sandi Mariscal M.D.   On: 09/09/2014 10:06    EKG: sinus rhythm with borderline prolonged QT INTERVAL.   Assessment/Plan Active Problems:   Hypotension  Hypotension: probably from dehydration and poor po intake.  Resolved after IV fluids.  Holding home anti hypertensives for now and restart them slowly as per BP improves.    Slurred speech, staggering gait and diplopia:? TIA. EDP consulted Dr Leonel Ramsay.  As per the patient they seem to have resolved.  CT &MRI brain negative for stroke.  No further work up needed as per neurology.  Resume aspirin 81 mg daily.   Renal insufficiency: Unclear if its acute vs chronic. Hydration and repeat in am.   Anemia: normocytic.  Stool for occult blood and anemia panel ordered.     DVT prophylaxis.       Code Status: full code.  DVT Prophylaxis: Family Communication: none at bedside.  Disposition Plan admit for observation  Time spent: 55 minutes.   [facility] Pager [phone]

## 2014-09-09 NOTE — ED Notes (Signed)
Patient transported to MRI, to be transported back to the ED after MRI and then transported to the floor.

## 2014-09-10 LAB — FOLATE: FOLATE: 14.8 ng/mL

## 2014-09-10 LAB — IRON AND TIBC
IRON: 20 ug/dL — AB (ref 42–135)
Saturation Ratios: 6 % — ABNORMAL LOW (ref 20–55)
TIBC: 328 ug/dL (ref 250–470)
UIBC: 308 ug/dL (ref 125–400)

## 2014-09-10 LAB — BASIC METABOLIC PANEL
ANION GAP: 11 (ref 5–15)
BUN: 16 mg/dL (ref 6–23)
CHLORIDE: 105 meq/L (ref 96–112)
CO2: 24 mEq/L (ref 19–32)
Calcium: 8.6 mg/dL (ref 8.4–10.5)
Creatinine, Ser: 0.8 mg/dL (ref 0.50–1.10)
GFR, EST AFRICAN AMERICAN: 86 mL/min — AB (ref >90)
GFR, EST NON AFRICAN AMERICAN: 74 mL/min — AB (ref >90)
Glucose, Bld: 106 mg/dL — ABNORMAL HIGH (ref 70–99)
Potassium: 4.1 mEq/L (ref 3.7–5.3)
Sodium: 140 mEq/L (ref 137–147)

## 2014-09-10 LAB — FERRITIN: FERRITIN: 13 ng/mL (ref 10–291)

## 2014-09-10 LAB — TSH: TSH: 0.406 u[IU]/mL (ref 0.350–4.500)

## 2014-09-10 LAB — VITAMIN B12: Vitamin B-12: 227 pg/mL (ref 211–911)

## 2014-09-10 LAB — RETICULOCYTES
RBC.: 3.69 MIL/uL — AB (ref 3.87–5.11)
RETIC COUNT ABSOLUTE: 55.4 10*3/uL (ref 19.0–186.0)
Retic Ct Pct: 1.5 % (ref 0.4–3.1)

## 2014-09-10 MED ORDER — IRBESARTAN 150 MG PO TABS
75.0000 mg | ORAL_TABLET | Freq: Every day | ORAL | Status: DC
Start: 2014-09-10 — End: 2014-09-10

## 2014-09-10 MED ORDER — OLMESARTAN MEDOXOMIL 40 MG PO TABS
20.0000 mg | ORAL_TABLET | Freq: Every day | ORAL | Status: AC
Start: 2014-09-10 — End: ?

## 2014-09-10 NOTE — Progress Notes (Signed)
Patient discharged home with husband. Assessment stable. Discharge instructions given. Patient voices understanding.

## 2014-09-10 NOTE — Progress Notes (Signed)
Utilization Review Completed.   Ilah Boule, RN, BSN Nurse Case Manager  

## 2014-09-10 NOTE — Discharge Summary (Signed)
Physician Discharge Summary  Tracy [name] MHW:[id number] DOB: 03/05/1946 DOA: 09/09/2014  PCP: Mayra Neer, MD  Admit date: 09/09/2014 Discharge date: 09/10/2014  Time spent: 20 minutes  Recommendations for Outpatient Follow-up:  1. D/c hctz  2. Note med changes below 3. Encouraged to get Bmet and cbc in a week and follow with PCP    Discharge Diagnoses:  Active Problems:   Hypotension   Anemia   Discharge Condition: good  Diet recommendation:  reg  Filed Weights   09/09/14 0858  Weight: 87.544 kg (193 lb)    History of present illness:  68 y/o ?  admit 09/09/14 confusion, hypotension and slurred speech found hypotensive and hypothermic MRI/Ct neg Found to have acute kidney injury thought to be the cause in addition to conitnued HCTZ use in setting of poor po intake D/c home as all symptoms resolved IV fluids replete and pressures better Ambulatory MIR/CT neg for acute event   Discharge Exam: Filed Vitals:   09/10/14 1025  BP: 141/63  Pulse: 82  Temp: 98.1 F (36.7 C)  Resp: 18   Alert pleasant oriented  General: nad Cardiovascular: s1 s 2no m/r/g Respiratory: clear  Discharge Instructions You were cared for by a hospitalist during your hospital stay. If you have any questions about your discharge medications or the care you received while you were in the hospital after you are discharged, you can call the unit and asked to speak with the hospitalist on call if the hospitalist that took care of you is not available. Once you are discharged, your primary care physician will handle any further medical issues. Please note that NO REFILLS for any discharge medications will be authorized once you are discharged, as it is imperative that you return to your primary care physician (or establish a relationship with a primary care physician if you do not have one) for your aftercare needs so that they can reassess your need for medications and monitor your lab  values.   Current Discharge Medication List    START taking these medications   Details  olmesartan (BENICAR) 40 MG tablet Take 0.5 tablets (20 mg total) by mouth daily. Qty: 30 tablet, Refills: 0      CONTINUE these medications which have NOT CHANGED   Details  atorvastatin (LIPITOR) 20 MG tablet Take 20 mg by mouth daily.    cholecalciferol (VITAMIN D) 1000 UNITS tablet Take 1,000 Units by mouth daily.    esomeprazole (NEXIUM) 40 MG capsule Take 40 mg by mouth daily before breakfast.    fish oil-omega-3 fatty acids 1000 MG capsule Take 3 g by mouth daily.     ibuprofen (ADVIL,MOTRIN) 200 MG tablet Take 600 mg by mouth every 6 (six) hours as needed for moderate pain.    metoprolol succinate (TOPROL-XL) 50 MG 24 hr tablet Take 50 mg by mouth daily. Take with or immediately following a meal.    venlafaxine XR (EFFEXOR-XR) 75 MG 24 hr capsule Take 75 mg by mouth daily.    zolpidem (AMBIEN) 10 MG tablet Take 5 mg by mouth at bedtime.      STOP taking these medications     olmesartan-hydrochlorothiazide (BENICAR HCT) 20-12.5 MG per tablet        No Known Allergies    The results of significant diagnostics from this hospitalization (including imaging, microbiology, ancillary and laboratory) are listed below for reference.    Significant Diagnostic Studies: Dg Chest 2 View  09/09/2014   CLINICAL DATA:  HYPOTENSION, dizzy, had  slurred speech, nausea today. Hx of HTN-on meds, DM- no meds,  EXAM: CHEST - 2 VIEW  COMPARISON:  09/09/2014  FINDINGS: Early hyperinflation with somewhat coarse bibasilar bronchovascular markings. No confluent airspace consolidation or overt edema. Heart size normal. No effusion. No pneumothorax. Surgical clips in the right upper abdomen. Visualized skeletal structures are unremarkable.  IMPRESSION: No acute cardiopulmonary disease.   Electronically Signed   By: Arne Cleveland M.D.   On: 09/09/2014 13:35   Ct Head Wo Contrast  09/09/2014   CLINICAL  DATA:  Patient began feeling drowsy at church this morning with associated slurred speech.  EXAM: CT HEAD WITHOUT CONTRAST  TECHNIQUE: Contiguous axial images were obtained from the base of the skull through the vertex without intravenous contrast.  COMPARISON:  None.  FINDINGS: There is no evidence of acute cortical infarct, intracranial hemorrhage, mass, midline shift, or extra-axial fluid collection. Ventricles and sulci are normal for age. Periventricular white-matter hypodensities are nonspecific but compatible with mild chronic small vessel ischemic disease.  Prior bilateral cataract extraction is noted. Visualized paranasal sinuses and mastoid air cells are clear. Mild carotid siphon calcification is noted.  IMPRESSION: 1. No evidence of acute intracranial abnormality. 2. Mild chronic small vessel ischemic disease.   Electronically Signed   By: Logan Bores   On: 09/09/2014 10:32   Mr Brain Wo Contrast  09/09/2014   CLINICAL DATA:  Altered mental status beginning earlier today while at church. Associated slurred speech. Stroke risk factors include diabetes and hypertension.  EXAM: MRI HEAD WITHOUT CONTRAST  TECHNIQUE: Multiplanar, multiecho pulse sequences of the brain and surrounding structures were obtained without intravenous contrast.  COMPARISON:  CT head earlier today.  FINDINGS: No evidence for acute infarction, hemorrhage, mass lesion, hydrocephalus, or extra-axial fluid. Normal for age cerebral volume. Mild to moderate T2 and FLAIR hyperintensities throughout the periventricular and subcortical white matter, also involving the brainstem, likely chronic microvascular ischemic change. Flow voids are maintained throughout the carotid, basilar, and vertebral arteries. There are no areas of chronic hemorrhage. Pituitary, pineal, and cerebellar tonsils unremarkable. No upper cervical lesions. Visualized calvarium, skull base, and upper cervical osseous structures unremarkable. Scalp and extracranial  soft tissues, orbits, sinuses, and mastoids show no acute process. BILATERAL cataract extraction.Good agreement with prior CT.  IMPRESSION: Chronic changes as described.  No acute intracranial findings.   Electronically Signed   By: Rolla Flatten M.D.   On: 09/09/2014 18:35   Dg Chest Portable 1 View  09/09/2014   CLINICAL DATA:  Hypotension. History of diabetes and hypertension. Initial encounter.  EXAM: PORTABLE CHEST - 1 VIEW  COMPARISON:  None.  FINDINGS: Normal cardiac silhouette and mediastinal contours. Ill-defined left basilar/retrocardiac heterogeneous opacities. No pleural effusion or pneumothorax. No evidence of edema. No acute osseus abnormalities.  IMPRESSION: Ill-defined left basilar/retrocardiac heterogeneous opacities, possibly atelectasis though underlying infection not excluded. Further evaluation with a PA and lateral chest radiograph may be obtained as clinically indicated.   Electronically Signed   By: Sandi Mariscal M.D.   On: 09/09/2014 10:06    Microbiology: No results found for this or any previous visit (from the past 240 hour(s)).   Labs: Basic Metabolic Panel:  Recent Labs Lab 09/09/14 0910 09/09/14 0919 09/10/14 0425  NA 138 138 140  K 3.8 3.8 4.1  CL 101 104 105  CO2 24  --  24  GLUCOSE 118* 121* 106*  BUN 25* 26* 16  CREATININE 1.17* 1.30* 0.80  CALCIUM 8.5  --  8.6  Liver Function Tests:  Recent Labs Lab 09/09/14 0910  AST 13  ALT 14  ALKPHOS 91  BILITOT 0.3  PROT 6.6  ALBUMIN 3.5   No results found for this basename: LIPASE, AMYLASE,  in the last 168 hours No results found for this basename: AMMONIA,  in the last 168 hours CBC:  Recent Labs Lab 09/09/14 0910 09/09/14 0919  WBC 6.5  --   NEUTROABS 4.1  --   HGB 9.5* 10.5*  HCT 30.6* 31.0*  MCV 80.5  --   PLT 272  --    Cardiac Enzymes:  Recent Labs Lab 09/09/14 0910  TROPONINI <0.30   BNP: BNP (last 3 results) No results found for this basename: PROBNP,  in the last 8760  hours CBG:  Recent Labs Lab 09/09/14 0901  GLUCAP 121*       SignedNita Sells  [facility] 09/10/2014, 10:33 AM

## 2014-09-10 NOTE — Evaluation (Signed)
Physical Therapy Evaluation Patient Details Name: Tracy [name] MRN: [id number] DOB: [birth year] Today's Date: 09/10/2014   History of Present Illness  Admitted after passing out at church yard sale; dehydrated and hypotensive; much better 10/4  Clinical Impression  Patient evaluated by Physical Therapy with no further acute PT needs identified. All education has been completed and the patient has no further questions.   PT is signing off. Thank you for this referral.     Follow Up Recommendations No PT follow up    Equipment Recommendations  None recommended by PT    Recommendations for Other Services       Precautions / Restrictions Precautions Precautions: None      Mobility  Bed Mobility Overal bed mobility: Independent                Transfers Overall transfer level: Independent                  Ambulation/Gait Ambulation/Gait assistance: Independent Ambulation Distance (Feet): 520 Feet Assistive device: None Gait Pattern/deviations: WFL(Within Functional Limits) Gait velocity: WNL Gait velocity interpretation: at or above normal speed for age/gender General Gait Details: No reports of dizziness during amb; BP 148/76 post amb  Stairs Stairs: Yes Stairs assistance: Independent Stair Management: No rails;Alternating pattern;Forwards Number of Stairs: 4.5 General stair comments: Independent  Wheelchair Mobility    Modified Rankin (Stroke Patients Only)       Balance Overall balance assessment: No apparent balance deficits (not formally assessed)                                           Pertinent Vitals/Pain Pain Assessment: No/denies pain    Home Living Family/patient expects to be discharged to:: Private residence Living Arrangements: Spouse/significant other Available Help at Discharge: Family;Available PRN/intermittently Type of Home: House Home Access: Stairs to enter   [employer] of Steps:  1 Home Layout: One level Home Equipment: None      Prior Function Level of Independence: Independent               Hand Dominance        Extremity/Trunk Assessment   Upper Extremity Assessment: Overall WFL for tasks assessed           Lower Extremity Assessment: Overall WFL for tasks assessed      Cervical / Trunk Assessment: Normal  Communication   Communication: No difficulties  Cognition Arousal/Alertness: Awake/alert Behavior During Therapy: WFL for tasks assessed/performed Overall Cognitive Status: Within Functional Limits for tasks assessed                      General Comments      Exercises        Assessment/Plan    PT Assessment Patent does not need any further PT services  PT Diagnosis Other (comment);Generalized weakness (Syncope)   PT Problem List    PT Treatment Interventions     PT Goals (Current goals can be found in the Care Plan section) Acute Rehab PT Goals Patient Stated Goal: To go home today PT Goal Formulation: No goals set, d/c therapy    Frequency     Barriers to discharge        Co-evaluation               End of Session   Activity Tolerance: Patient tolerated treatment well  Patient left: in bed;with call bell/phone within reach Nurse Communication: Mobility status;Other (comment) (BPs taken)    Functional Assessment Tool Used: Clinical Judgement Functional Limitation: Mobility: Walking and moving around Mobility: Walking and Moving Around Current Status [phone]): 0 percent impaired, limited or restricted Mobility: Walking and Moving Around Goal Status [phone]): 0 percent impaired, limited or restricted Mobility: Walking and Moving Around Discharge Status [phone]): 0 percent impaired, limited or restricted    Time: 4010-2725 PT Time Calculation (min): 22 min   Charges:   PT Evaluation $Initial PT Evaluation Tier I: 1 Procedure PT Treatments $Gait Training: 8-22 mins   PT G Codes:   Functional  Assessment Tool Used: Clinical Judgement Functional Limitation: Mobility: Walking and moving around    [place] 09/10/2014, 12:23 PM  Roney Marion, [place] Pager [phone] Office [phone]

## 2014-09-20 ENCOUNTER — Other Ambulatory Visit: Payer: Self-pay | Admitting: Family Medicine

## 2014-09-20 DIAGNOSIS — R4182 Altered mental status, unspecified: Secondary | ICD-10-CM

## 2014-09-22 ENCOUNTER — Ambulatory Visit
Admission: RE | Admit: 2014-09-22 | Discharge: 2014-09-22 | Disposition: A | Discharge status: Home / Self Care | Payer: Medicare Other | Source: Ambulatory Visit | Attending: Family Medicine | Admitting: Family Medicine

## 2014-09-22 DIAGNOSIS — R4182 Altered mental status, unspecified: Secondary | ICD-10-CM

## 2014-09-27 ENCOUNTER — Other Ambulatory Visit: Payer: Self-pay | Admitting: Family Medicine

## 2014-09-27 ENCOUNTER — Ambulatory Visit
Admission: RE | Admit: 2014-09-27 | Discharge: 2014-09-27 | Disposition: A | Discharge status: Home / Self Care | Payer: Medicare Other | Source: Ambulatory Visit | Attending: Family Medicine | Admitting: Family Medicine

## 2014-09-27 DIAGNOSIS — E041 Nontoxic single thyroid nodule: Secondary | ICD-10-CM

## 2014-10-02 ENCOUNTER — Other Ambulatory Visit: Payer: Self-pay | Admitting: Family Medicine

## 2014-10-02 DIAGNOSIS — E041 Nontoxic single thyroid nodule: Secondary | ICD-10-CM

## 2014-10-10 ENCOUNTER — Other Ambulatory Visit (HOSPITAL_COMMUNITY)
Admission: RE | Admit: 2014-10-10 | Discharge: 2014-10-10 | Disposition: A | Discharge status: Home / Self Care | Payer: Medicare Other | Source: Ambulatory Visit | Attending: Interventional Radiology | Admitting: Interventional Radiology

## 2014-10-10 ENCOUNTER — Ambulatory Visit
Admission: RE | Admit: 2014-10-10 | Discharge: 2014-10-10 | Disposition: A | Discharge status: Home / Self Care | Payer: Medicare Other | Source: Ambulatory Visit | Attending: Family Medicine | Admitting: Family Medicine

## 2014-10-10 DIAGNOSIS — E041 Nontoxic single thyroid nodule: Secondary | ICD-10-CM | POA: Diagnosis present

## 2015-07-09 ENCOUNTER — Other Ambulatory Visit: Payer: Self-pay

## 2015-07-09 DIAGNOSIS — Z1231 Encounter for screening mammogram for malignant neoplasm of breast: Secondary | ICD-10-CM

## 2015-07-13 ENCOUNTER — Ambulatory Visit
Admission: RE | Admit: 2015-07-13 | Discharge: 2015-07-13 | Disposition: A | Discharge status: Home / Self Care | Payer: Medicare Other | Source: Ambulatory Visit

## 2015-07-13 DIAGNOSIS — Z1231 Encounter for screening mammogram for malignant neoplasm of breast: Secondary | ICD-10-CM

## 2015-07-17 ENCOUNTER — Other Ambulatory Visit: Payer: Self-pay | Admitting: Family Medicine

## 2015-07-17 ENCOUNTER — Other Ambulatory Visit: Payer: Self-pay | Admitting: Advanced Practice Midwife

## 2015-07-17 DIAGNOSIS — R928 Other abnormal and inconclusive findings on diagnostic imaging of breast: Secondary | ICD-10-CM

## 2015-07-20 ENCOUNTER — Ambulatory Visit
Admission: RE | Admit: 2015-07-20 | Discharge: 2015-07-20 | Disposition: A | Discharge status: Home / Self Care | Payer: Medicare Other | Source: Ambulatory Visit | Attending: Family Medicine | Admitting: Family Medicine

## 2015-07-20 DIAGNOSIS — R928 Other abnormal and inconclusive findings on diagnostic imaging of breast: Secondary | ICD-10-CM

## 2016-01-29 ENCOUNTER — Other Ambulatory Visit: Payer: Self-pay | Admitting: Family Medicine

## 2016-01-29 DIAGNOSIS — E042 Nontoxic multinodular goiter: Secondary | ICD-10-CM

## 2016-02-01 ENCOUNTER — Ambulatory Visit
Admission: RE | Admit: 2016-02-01 | Discharge: 2016-02-01 | Disposition: A | Discharge status: Home / Self Care | Payer: Medicare Other | Source: Ambulatory Visit | Attending: Family Medicine | Admitting: Family Medicine

## 2016-02-01 DIAGNOSIS — E042 Nontoxic multinodular goiter: Secondary | ICD-10-CM

## 2016-02-01 IMAGING — CR DG CHEST 2V
2 series · 2 of 2 positions shown · non-contrast
Comparison: 09/09/2014

CLINICAL DATA: HYPOTENSION, dizzy, had slurred speech, nausea
today. Hx of HTN-on meds, DM- no meds,

EXAM:
CHEST - 2 VIEW

[w chest pa]
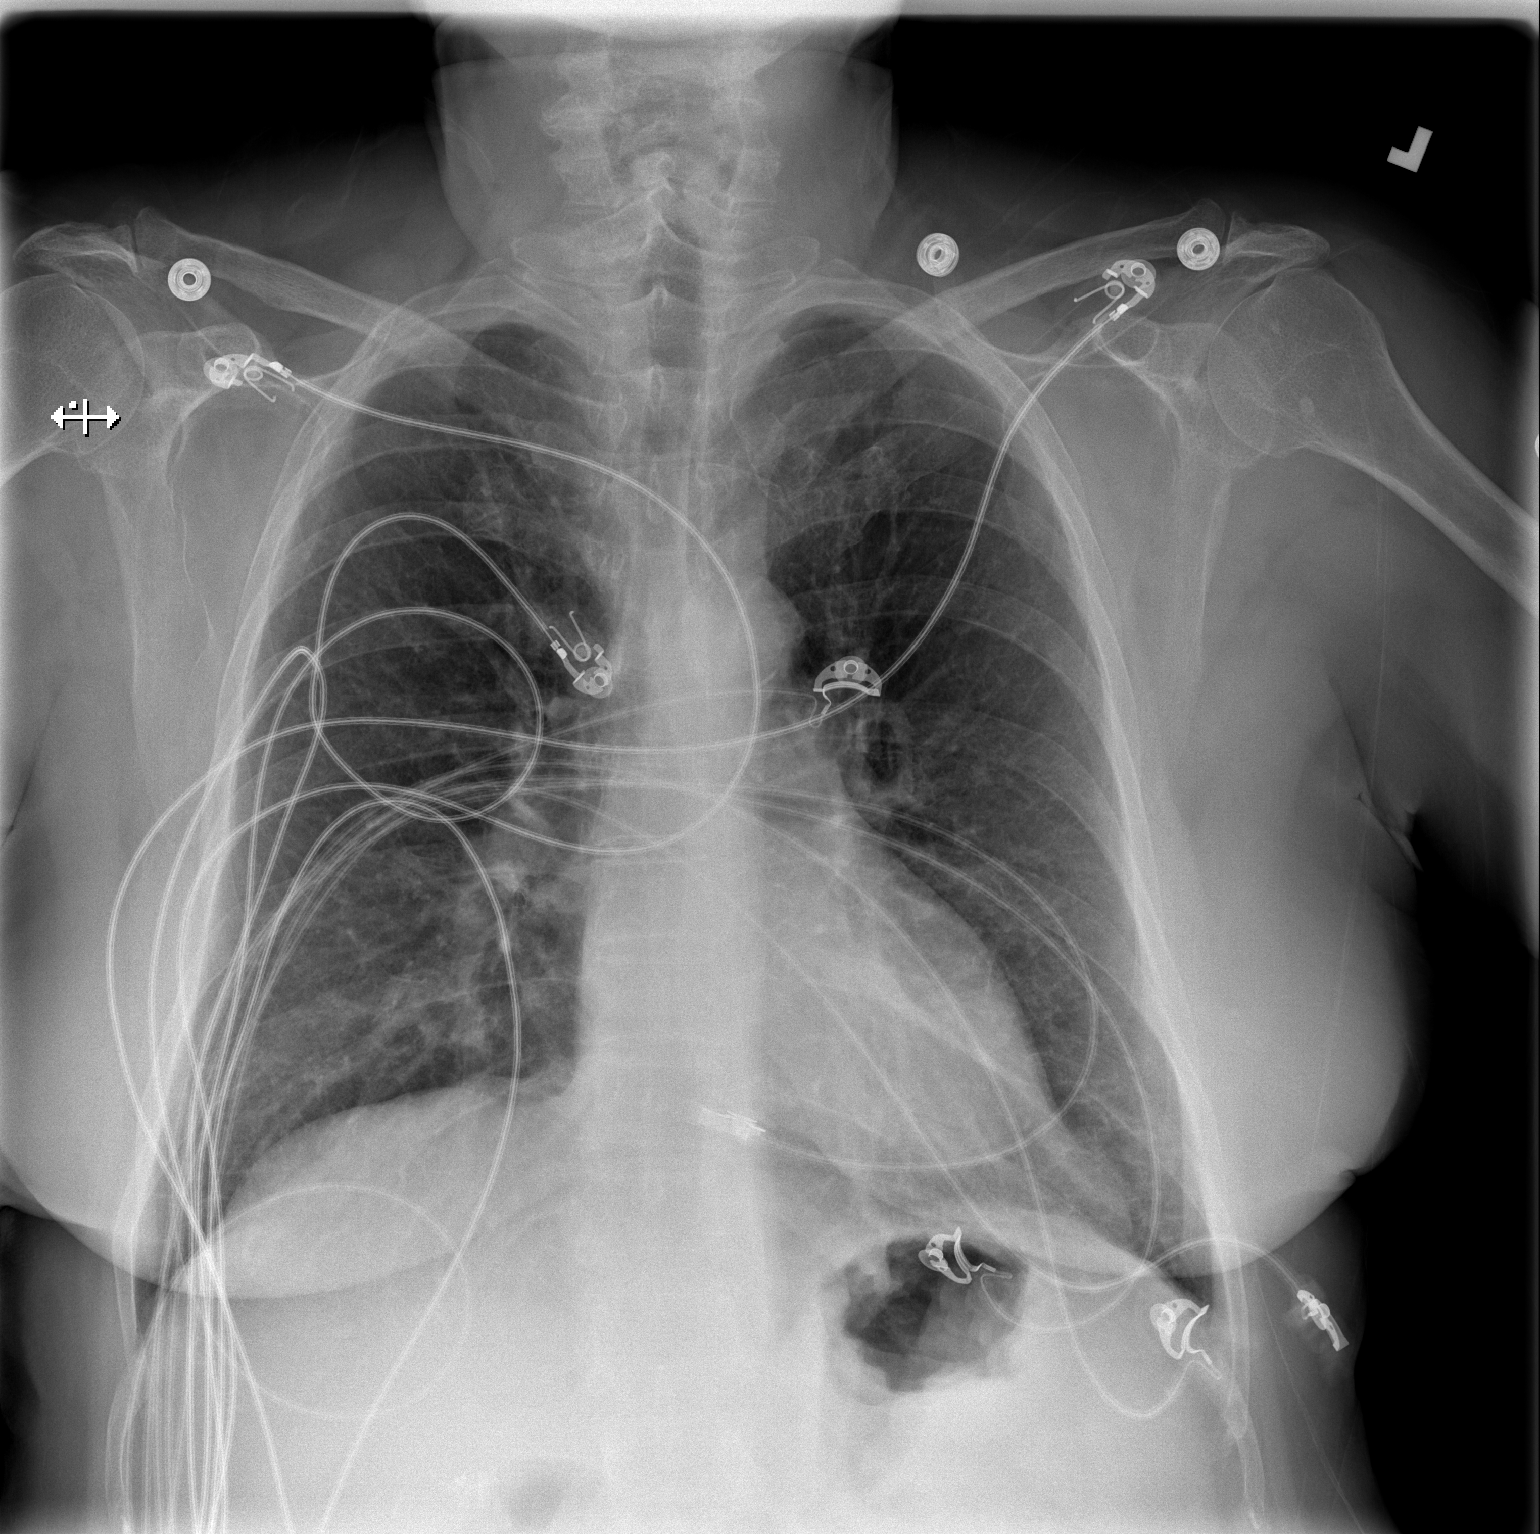

[w chest lat]
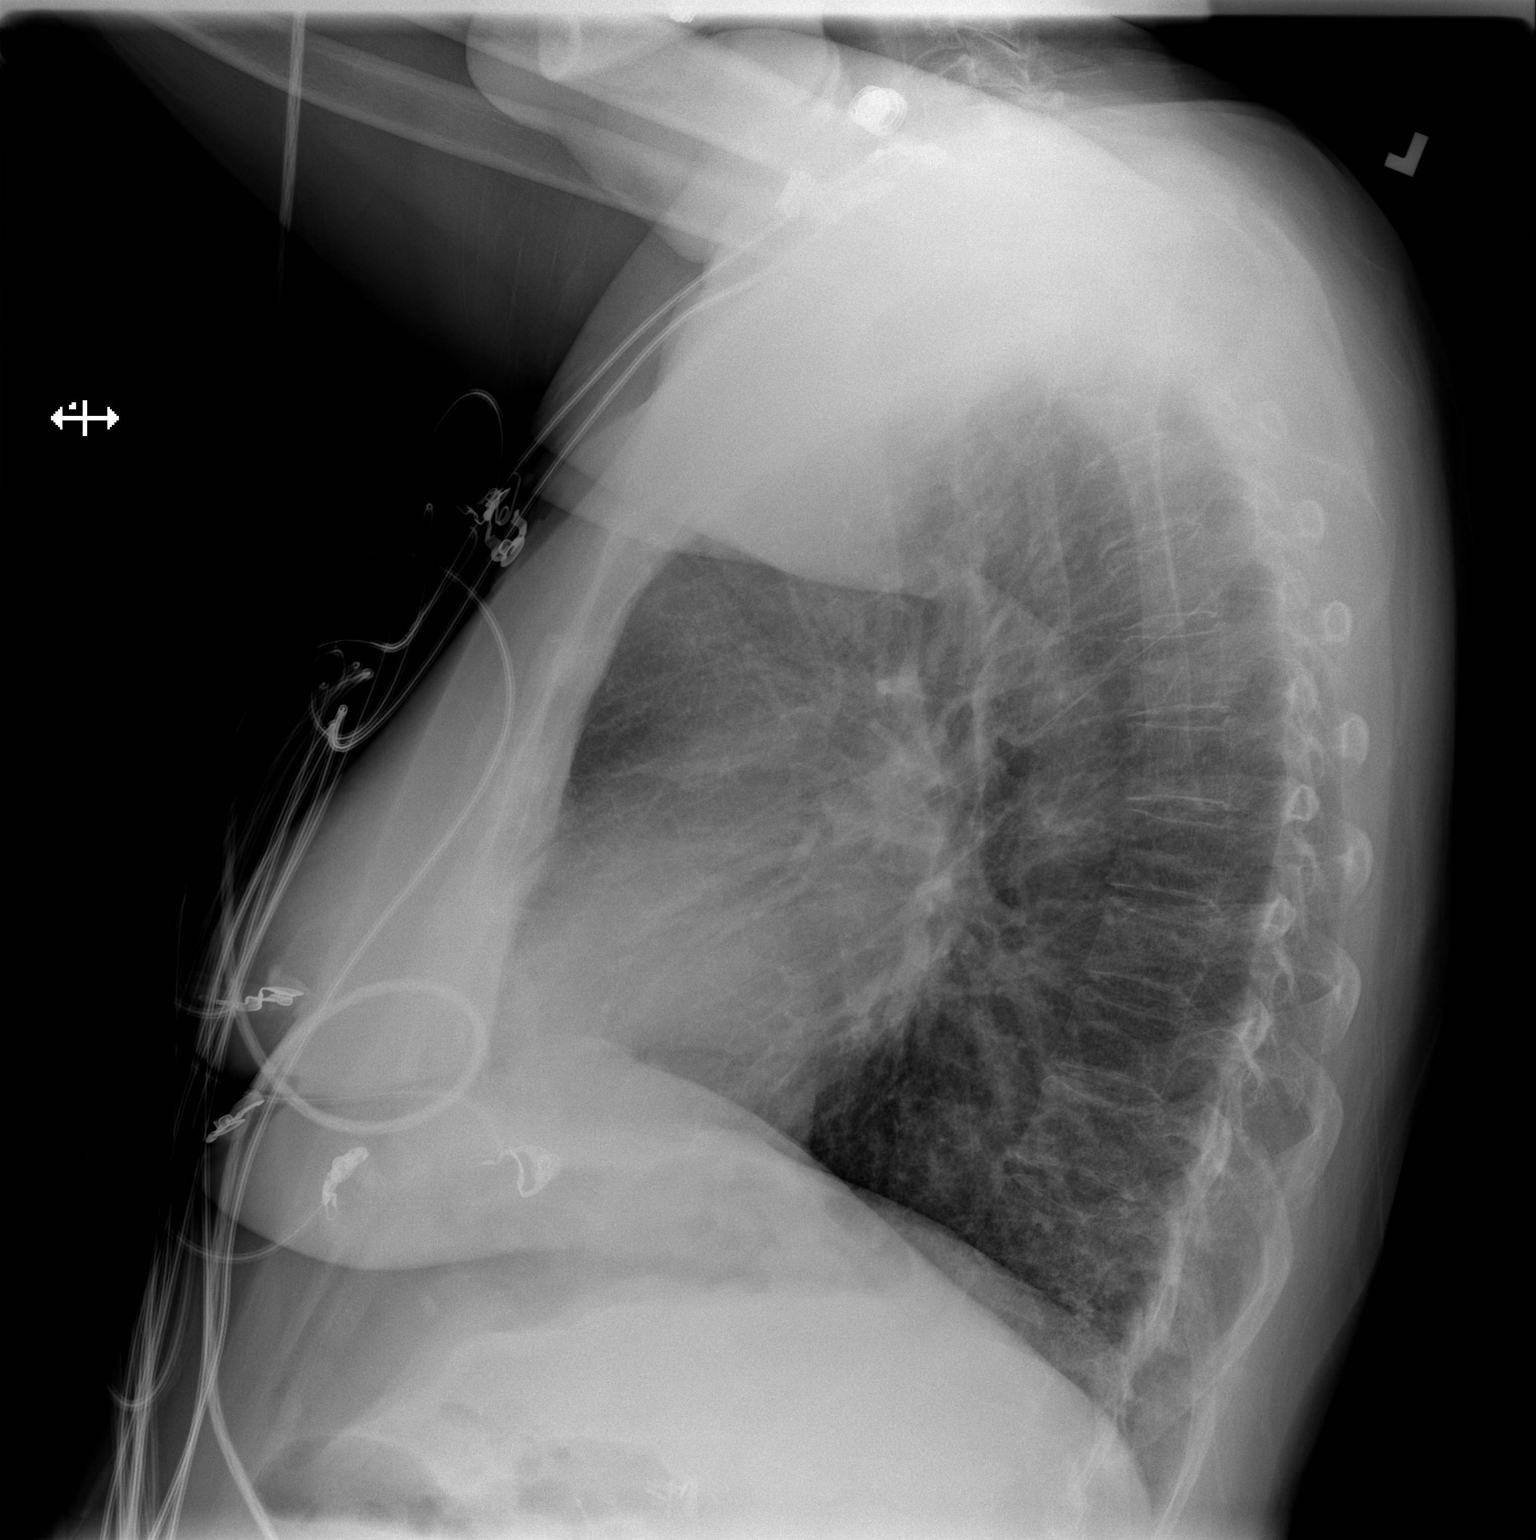

[2 of 2 positions shown; findings below may reference images not displayed]

FINDINGS: Early hyperinflation with somewhat coarse bibasilar bronchovascular
markings. No confluent airspace consolidation or overt edema. Heart
size normal.
No effusion. No pneumothorax. Surgical clips in the right upper
abdomen.
Visualized skeletal structures are unremarkable.
IMPRESSION: No acute cardiopulmonary disease.

## 2016-02-01 IMAGING — MR MR HEAD W/O CM
9 of 10 series · 35 of 48 positions shown · non-contrast
Comparison: CT head earlier today.

CLINICAL DATA: Altered mental status beginning earlier today while
at church. Associated slurred speech. Stroke risk factors include
diabetes and hypertension.

EXAM:
MRI HEAD WITHOUT CONTRAST
TECHNIQUE: Multiplanar, multiecho pulse sequences of the brain and surrounding
structures were obtained without intravenous contrast.

[Series 3: T1 · sagittal · 5.0mm · 0.47mm/px · 2 of 25 slices shown]
[im 1/25]
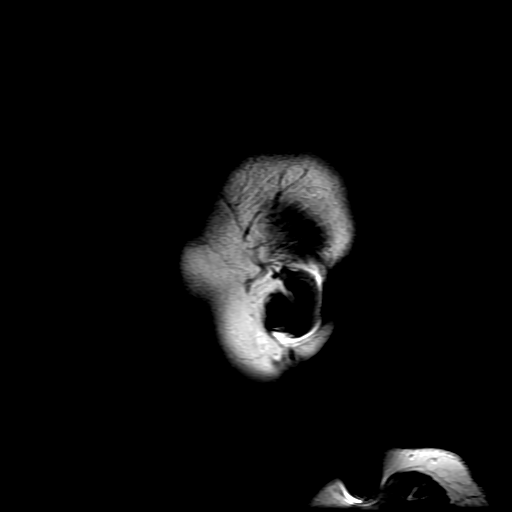
[im 25/25]
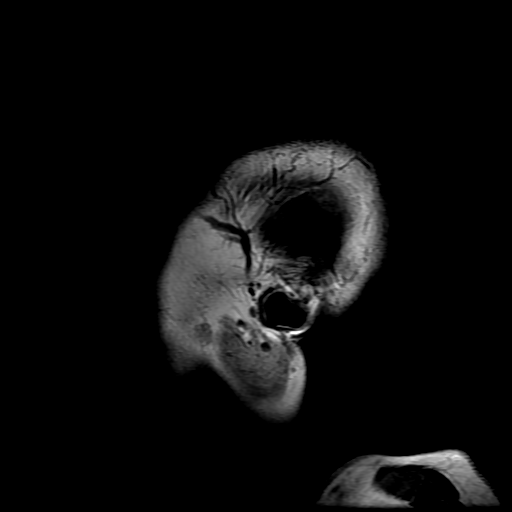

[Series 4: DWI · axial · 5.0mm · 1.09mm/px · z∈[-90,+55]mm · 7 of 60 slices shown (1 of 4)]
[im 1/60]
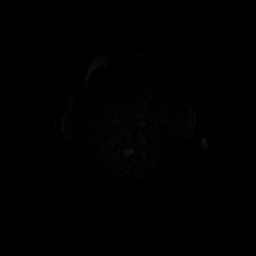
[im 10/60]
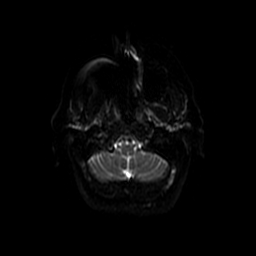
[im 20/60]
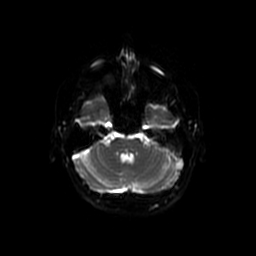
[im 30/60]
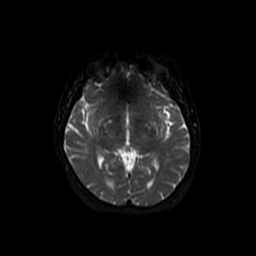
[im 40/60]
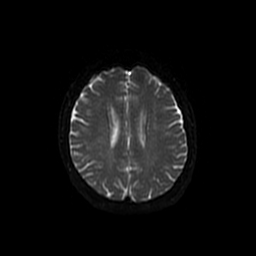
[im 50/60]
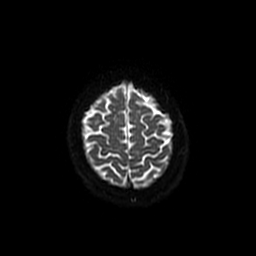
[im 60/60]
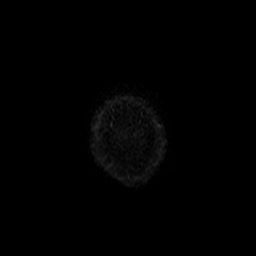

[Series 5: DWI · coronal · 5.0mm · 1.09mm/px · 8 of 66 slices shown (2 of 4)]
[im 1/66]
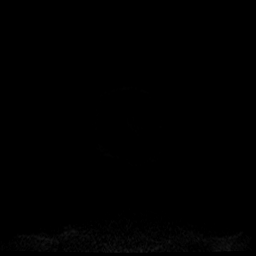
[im 10/66]
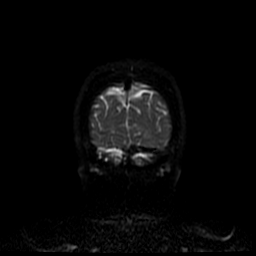
[im 19/66]
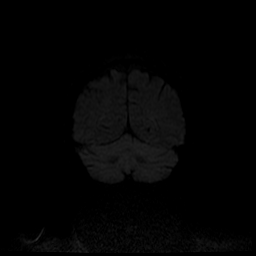
[im 28/66]
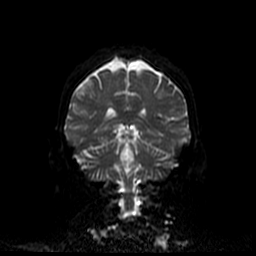
[im 38/66]
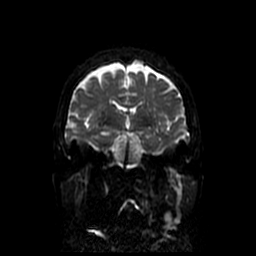
[im 47/66]
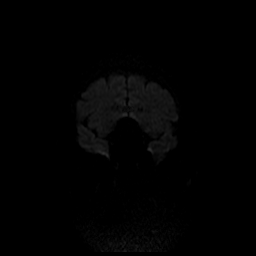
[im 56/66]
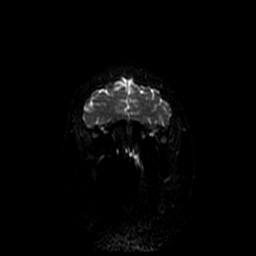
[im 66/66]
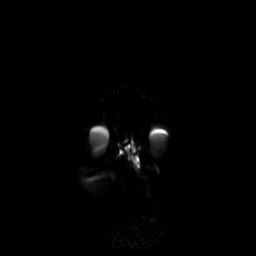

[Series 6: T2 · axial · 5.0mm · 0.43mm/px · z∈[-86,+51]mm · 3 of 24 slices shown (1 of 2)]
[im 1/24]
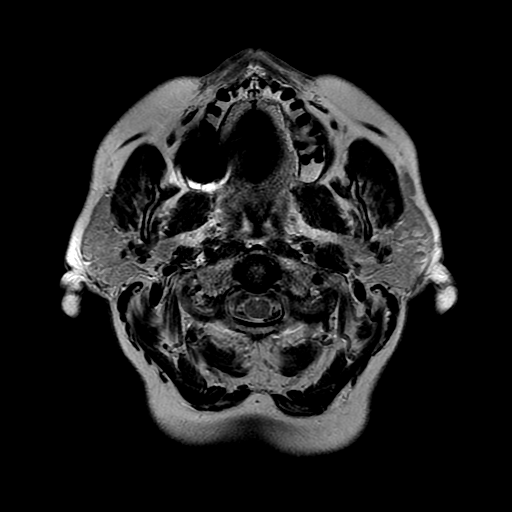
[im 12/24]
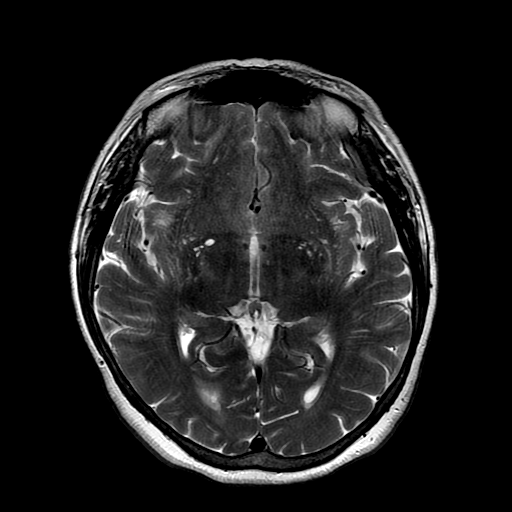
[im 24/24]
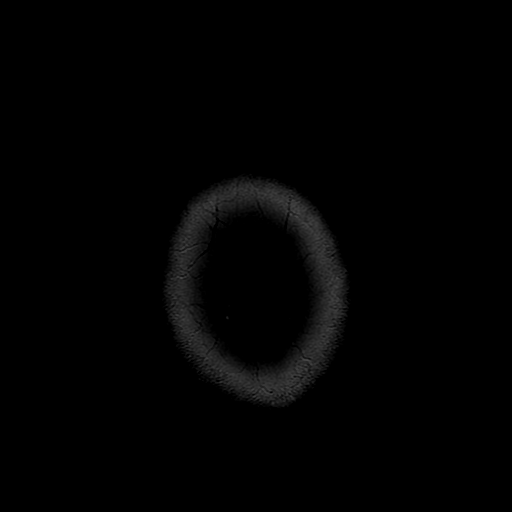

[Series 7: FLAIR · axial · 5.0mm · 0.43mm/px · z∈[-86,+51]mm · 3 of 24 slices shown]
[im 1/24]
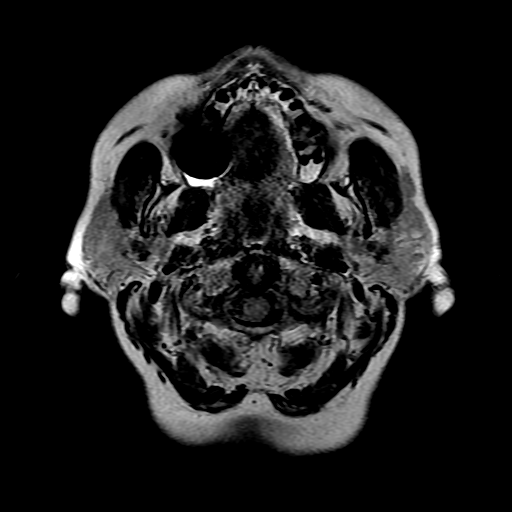
[im 12/24]
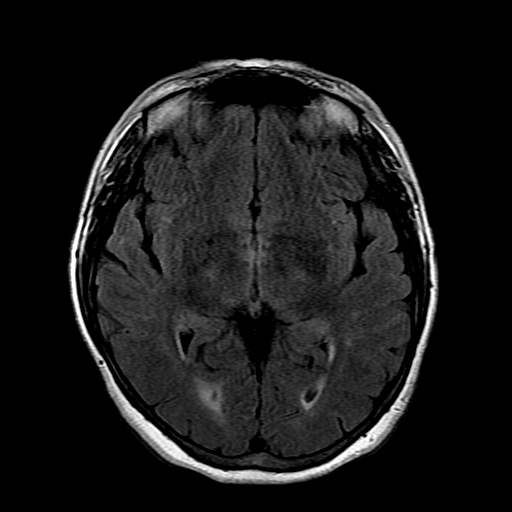
[im 24/24]
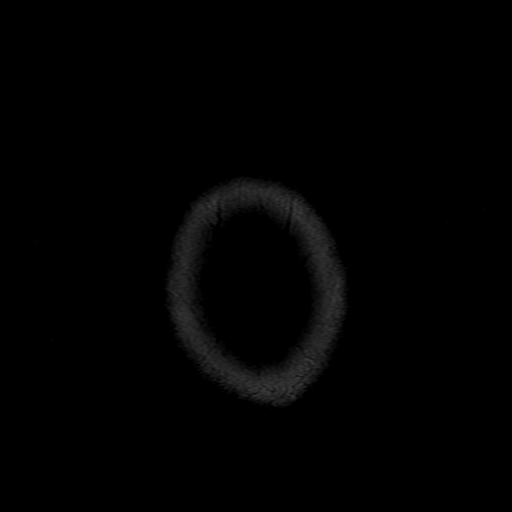

[Series 8: ax mpgr · axial · 5.0mm · 0.43mm/px · 1 of 21 slices shown]
[im 1/21]
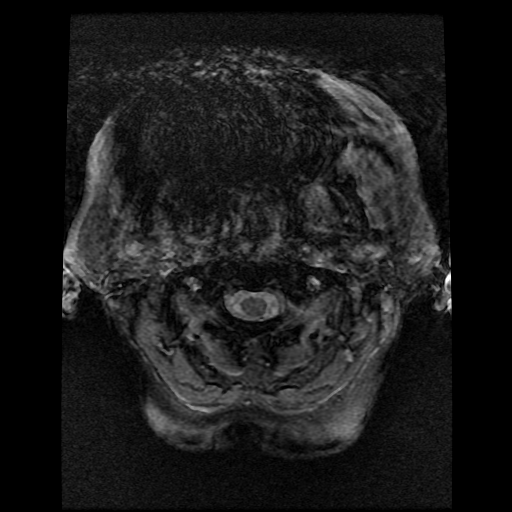

[Series 10: T2 · coronal · 5.0mm · 0.39mm/px · 3 of 25 slices shown (2 of 2)]
[im 1/25]
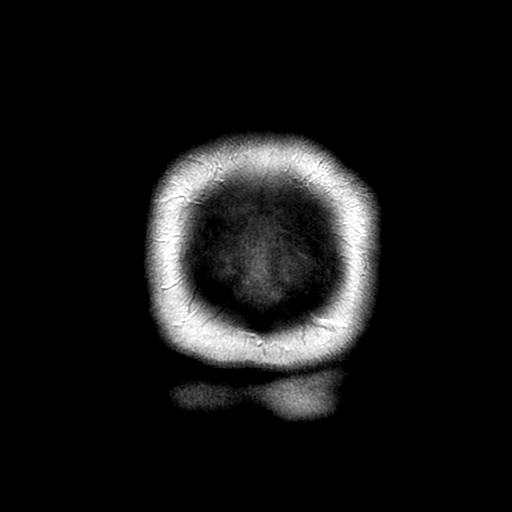
[im 13/25]
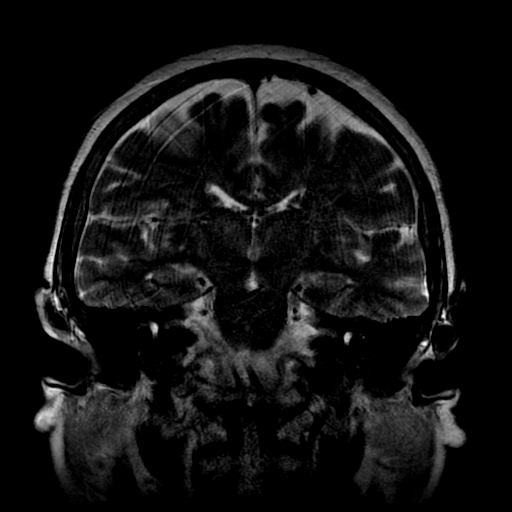
[im 25/25]
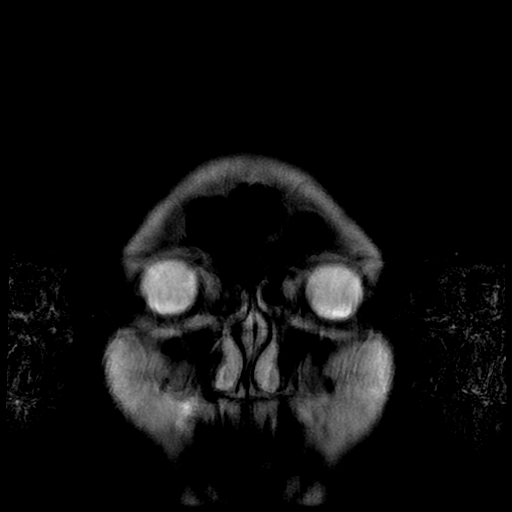

[Series 400: DWI · axial · 5.0mm · 1.09mm/px · z∈[-90,+55]mm · 4 of 30 slices shown (3 of 4)]
[im 1/30]
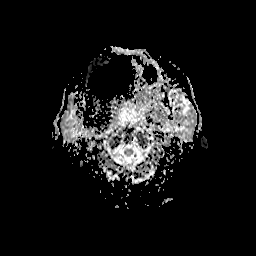
[im 10/30]
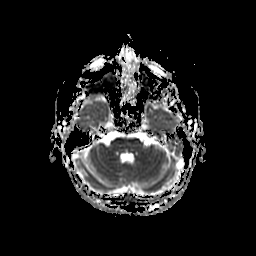
[im 20/30]
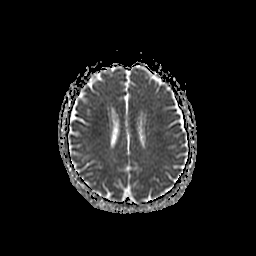
[im 30/30]
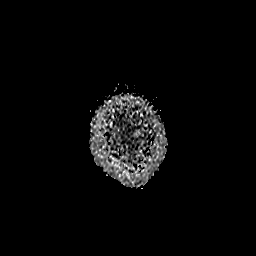

[Series 500: DWI · coronal · 5.0mm · 1.09mm/px · 4 of 33 slices shown (4 of 4)]
[im 1/33]
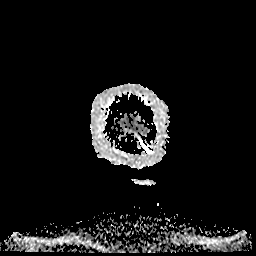
[im 11/33]
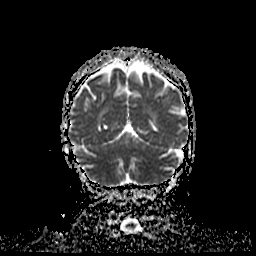
[im 22/33]
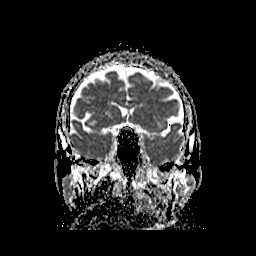
[im 33/33]
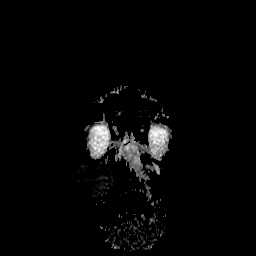

[35 of 48 positions shown; findings below may reference images not displayed]

FINDINGS: No evidence for acute infarction, hemorrhage, mass lesion,
hydrocephalus, or extra-axial fluid. Normal for age cerebral volume.
Mild to moderate T2 and FLAIR hyperintensities throughout the
periventricular and subcortical white matter, also involving the
brainstem, likely chronic microvascular ischemic change. Flow voids
are maintained throughout the carotid, basilar, and vertebral
arteries. There are no areas of chronic hemorrhage. Pituitary,
pineal, and cerebellar tonsils unremarkable. No upper cervical
lesions. Visualized calvarium, skull base, and upper cervical
osseous structures unremarkable. Scalp and extracranial soft
tissues, orbits, sinuses, and mastoids show no acute process.
BILATERAL cataract extraction.Good agreement with prior CT.
IMPRESSION: Chronic changes as described.

No acute intracranial findings.

## 2016-12-28 DIAGNOSIS — J209 Acute bronchitis, unspecified: Secondary | ICD-10-CM | POA: Diagnosis not present

## 2017-02-23 ENCOUNTER — Other Ambulatory Visit: Payer: Self-pay | Admitting: Family Medicine

## 2017-02-23 DIAGNOSIS — E042 Nontoxic multinodular goiter: Secondary | ICD-10-CM

## 2017-02-23 DIAGNOSIS — R5383 Other fatigue: Secondary | ICD-10-CM | POA: Diagnosis not present

## 2017-02-26 ENCOUNTER — Ambulatory Visit
Admission: RE | Admit: 2017-02-26 | Discharge: 2017-02-26 | Disposition: A | Discharge status: Home / Self Care | Payer: Medicare HMO | Source: Ambulatory Visit | Attending: Family Medicine | Admitting: Family Medicine

## 2017-02-26 DIAGNOSIS — E042 Nontoxic multinodular goiter: Secondary | ICD-10-CM

## 2017-06-22 ENCOUNTER — Other Ambulatory Visit: Payer: Self-pay | Admitting: Family Medicine

## 2017-06-22 DIAGNOSIS — I10 Essential (primary) hypertension: Secondary | ICD-10-CM | POA: Diagnosis not present

## 2017-06-22 DIAGNOSIS — E1165 Type 2 diabetes mellitus with hyperglycemia: Secondary | ICD-10-CM | POA: Diagnosis not present

## 2017-06-22 DIAGNOSIS — M8580 Other specified disorders of bone density and structure, unspecified site: Secondary | ICD-10-CM | POA: Diagnosis not present

## 2017-06-22 DIAGNOSIS — Z1159 Encounter for screening for other viral diseases: Secondary | ICD-10-CM | POA: Diagnosis not present

## 2017-06-22 DIAGNOSIS — N3281 Overactive bladder: Secondary | ICD-10-CM | POA: Diagnosis not present

## 2017-06-22 DIAGNOSIS — F322 Major depressive disorder, single episode, severe without psychotic features: Secondary | ICD-10-CM | POA: Diagnosis not present

## 2017-06-22 DIAGNOSIS — Z1231 Encounter for screening mammogram for malignant neoplasm of breast: Secondary | ICD-10-CM

## 2017-06-22 DIAGNOSIS — E782 Mixed hyperlipidemia: Secondary | ICD-10-CM | POA: Diagnosis not present

## 2017-06-22 DIAGNOSIS — G4700 Insomnia, unspecified: Secondary | ICD-10-CM | POA: Diagnosis not present

## 2017-06-22 DIAGNOSIS — Z0000 Encounter for general adult medical examination without abnormal findings: Secondary | ICD-10-CM | POA: Diagnosis not present

## 2017-06-22 DIAGNOSIS — E669 Obesity, unspecified: Secondary | ICD-10-CM | POA: Diagnosis not present

## 2017-07-01 ENCOUNTER — Ambulatory Visit
Admission: RE | Admit: 2017-07-01 | Discharge: 2017-07-01 | Disposition: A | Discharge status: Home / Self Care | Payer: Medicare HMO | Source: Ambulatory Visit | Attending: Family Medicine | Admitting: Family Medicine

## 2017-07-01 DIAGNOSIS — Z1231 Encounter for screening mammogram for malignant neoplasm of breast: Secondary | ICD-10-CM

## 2017-07-02 ENCOUNTER — Other Ambulatory Visit: Payer: Self-pay | Admitting: Family Medicine

## 2017-07-02 DIAGNOSIS — R928 Other abnormal and inconclusive findings on diagnostic imaging of breast: Secondary | ICD-10-CM

## 2017-07-03 ENCOUNTER — Other Ambulatory Visit: Payer: Medicare HMO

## 2017-07-08 ENCOUNTER — Ambulatory Visit
Admission: RE | Admit: 2017-07-08 | Discharge: 2017-07-08 | Disposition: A | Discharge status: Home / Self Care | Payer: Medicare HMO | Source: Ambulatory Visit | Attending: Family Medicine | Admitting: Family Medicine

## 2017-07-08 ENCOUNTER — Ambulatory Visit: Payer: Medicare HMO

## 2017-07-08 DIAGNOSIS — R922 Inconclusive mammogram: Secondary | ICD-10-CM | POA: Diagnosis not present

## 2017-07-08 DIAGNOSIS — R928 Other abnormal and inconclusive findings on diagnostic imaging of breast: Secondary | ICD-10-CM

## 2017-08-05 DIAGNOSIS — M8588 Other specified disorders of bone density and structure, other site: Secondary | ICD-10-CM | POA: Diagnosis not present

## 2017-08-06 DIAGNOSIS — E7800 Pure hypercholesterolemia, unspecified: Secondary | ICD-10-CM | POA: Diagnosis not present

## 2017-08-06 DIAGNOSIS — I10 Essential (primary) hypertension: Secondary | ICD-10-CM | POA: Diagnosis not present

## 2017-08-06 DIAGNOSIS — E119 Type 2 diabetes mellitus without complications: Secondary | ICD-10-CM | POA: Diagnosis not present

## 2017-08-06 DIAGNOSIS — H3530 Unspecified macular degeneration: Secondary | ICD-10-CM | POA: Diagnosis not present

## 2017-08-06 DIAGNOSIS — Z0100 Encounter for examination of eyes and vision without abnormal findings: Secondary | ICD-10-CM | POA: Diagnosis not present

## 2018-01-19 DIAGNOSIS — E669 Obesity, unspecified: Secondary | ICD-10-CM | POA: Diagnosis not present

## 2018-01-19 DIAGNOSIS — I10 Essential (primary) hypertension: Secondary | ICD-10-CM | POA: Diagnosis not present

## 2018-01-19 DIAGNOSIS — E119 Type 2 diabetes mellitus without complications: Secondary | ICD-10-CM | POA: Diagnosis not present

## 2018-01-19 DIAGNOSIS — E042 Nontoxic multinodular goiter: Secondary | ICD-10-CM | POA: Diagnosis not present

## 2018-01-19 DIAGNOSIS — Z6834 Body mass index (BMI) 34.0-34.9, adult: Secondary | ICD-10-CM | POA: Diagnosis not present

## 2018-01-19 DIAGNOSIS — F322 Major depressive disorder, single episode, severe without psychotic features: Secondary | ICD-10-CM | POA: Diagnosis not present

## 2018-01-19 DIAGNOSIS — E782 Mixed hyperlipidemia: Secondary | ICD-10-CM | POA: Diagnosis not present

## 2018-03-22 ENCOUNTER — Telehealth (INDEPENDENT_AMBULATORY_CARE_PROVIDER_SITE_OTHER): Payer: Self-pay | Admitting: Orthopaedic Surgery

## 2018-03-22 NOTE — Telephone Encounter (Signed)
Patient has previously seen Dr. Paulla Fore for her right knee. She is requesting a cortisone injection. The appt is made for 4/22.

## 2018-03-29 ENCOUNTER — Ambulatory Visit (INDEPENDENT_AMBULATORY_CARE_PROVIDER_SITE_OTHER): Payer: Medicare HMO | Admitting: Orthopaedic Surgery

## 2018-03-29 ENCOUNTER — Encounter (INDEPENDENT_AMBULATORY_CARE_PROVIDER_SITE_OTHER): Payer: Self-pay | Admitting: Orthopaedic Surgery

## 2018-03-29 DIAGNOSIS — G8929 Other chronic pain: Secondary | ICD-10-CM | POA: Insufficient documentation

## 2018-03-29 DIAGNOSIS — M25561 Pain in right knee: Secondary | ICD-10-CM | POA: Diagnosis not present

## 2018-03-29 DIAGNOSIS — M1711 Unilateral primary osteoarthritis, right knee: Secondary | ICD-10-CM | POA: Diagnosis not present

## 2018-03-29 MED ORDER — LIDOCAINE HCL 1 % IJ SOLN
3.0000 mL | INTRAMUSCULAR | Status: AC | PRN
  Administered 2018-03-29: 3 mL

## 2018-03-29 MED ORDER — METHYLPREDNISOLONE ACETATE 40 MG/ML IJ SUSP
40.0000 mg | INTRAMUSCULAR | Status: AC | PRN
  Administered 2018-03-29: 40 mg via INTRA_ARTICULAR

## 2018-03-29 NOTE — Progress Notes (Signed)
Office Visit Note   Patient: Tracy [name]           Date of Birth: 05-Dec-1946           MRN: [id number] Visit Date: 03/29/2018              Requested by: Mayra Neer, MD [address] [place], [place] [address] PCP: Mayra Neer, MD   Assessment & Plan: Visit Diagnoses:  1. Chronic pain of right knee   2. Unilateral primary osteoarthritis, right knee     Plan: It is definitely worth trying another steroid injection in her knee and I do feel that she should follow this up with a hyaluronic acid injection given the arthritic changes in her knee.  I explained the risk and benefits of these types of injections.  I gave her handout on hyaluronic acid as well.  She tolerated steroid injection well.  We will work on getting authorization for the hyaluronic acid injection in her knee.  All questions concerns were answered and addressed otherwise.  Follow-Up Instructions: Return in about 1 month (around 04/26/2018).   Orders:  Orders Placed This Encounter  Procedures  . Large Joint Inj   No orders of the defined types were placed in this encounter.     Procedures: Large Joint Inj: R knee on 03/29/2018 4:29 PM Indications: diagnostic evaluation and pain Details: 22 G 1.5 in needle, superolateral approach  Arthrogram: No  Medications: 3 mL lidocaine 1 %; 40 mg methylPREDNISolone acetate 40 MG/ML Outcome: tolerated well, no immediate complications Procedure, treatment alternatives, risks and benefits explained, specific risks discussed. Consent was given by the patient. Immediately prior to procedure a time out was called to verify the correct patient, procedure, equipment, support staff and site/side marked as required. Patient was prepped and draped in the usual sterile fashion.       Clinical Data: No additional findings.   Subjective: Chief Complaint  Patient presents with  . Right Knee - Pain, Edema  The patient has a history of right knee pain and  comes in today requesting a steroid shot in her right knee.  She has had locking catching before and steroids have helped in the past.  Her last injection was about 2 years ago.  She points mainly to the lateral joint line source of her pain but does get a lot of popping in her knee with going up and down stairs and with bending activities of the knee.  She denies any acute injuries.  Steroid injections have helped in the past.  HPI  Review of Systems She currently denies any headache, chest pain, shortness of breath, fever, chills, nausea, vomiting.  Objective: Vital Signs: There were no vitals taken for this visit.  Physical Exam She is alert and oriented x3 and in no acute distress Ortho Exam Examination of her right knee she is slight varus malalignment.  There is lateral joint line tenderness but no effusion.  The knee is ligamentously stable. Specialty Comments:  No specialty comments available.  Imaging: No results found.   PMFS History: Patient Active Problem List   Diagnosis Date Noted  . Unilateral primary osteoarthritis, right knee 03/29/2018  . Chronic pain of right knee 03/29/2018  . Hypotension 09/09/2014  . Anemia 09/09/2014   Past Medical History:  Diagnosis Date  . Diabetes mellitus without complication ([place])   . Hyperlipidemia   . Hypertension     Family History  Problem Relation Age of Onset  .  Hyperlipidemia Other   . Hypertension Other   . Stroke Other     Past Surgical History:  Procedure Laterality Date  . CHOLECYSTECTOMY     Social History   Occupational History  . Not on file  Tobacco Use  . Smoking status: Never Smoker  Substance and Sexual Activity  . Alcohol use: Not on file  . Drug use: Not on file  . Sexual activity: Not on file

## 2018-03-31 ENCOUNTER — Telehealth (INDEPENDENT_AMBULATORY_CARE_PROVIDER_SITE_OTHER): Payer: Self-pay

## 2018-03-31 NOTE — Telephone Encounter (Signed)
Submitted application online for SynviscOne injection, right knee.

## 2018-04-30 ENCOUNTER — Telehealth (INDEPENDENT_AMBULATORY_CARE_PROVIDER_SITE_OTHER): Payer: Self-pay

## 2018-04-30 NOTE — Telephone Encounter (Signed)
PA was required for SynviscOne, right knee.  Per PA form SynviscOne is not a preferred drug.  Submitted application online for Monovisc injection, right knee, which is a preferred drug and does not require a PA for Monovisc injection.

## 2018-05-21 ENCOUNTER — Telehealth (INDEPENDENT_AMBULATORY_CARE_PROVIDER_SITE_OTHER): Payer: Self-pay | Admitting: Radiology

## 2018-05-21 NOTE — Telephone Encounter (Signed)
Approved monovisc injection $35 copay. LMOM for patient to call back and schedule appt.

## 2018-05-24 NOTE — Telephone Encounter (Signed)
Scheduled

## 2018-06-03 ENCOUNTER — Ambulatory Visit (INDEPENDENT_AMBULATORY_CARE_PROVIDER_SITE_OTHER): Payer: Medicare HMO | Admitting: Physician Assistant

## 2018-06-03 ENCOUNTER — Encounter (INDEPENDENT_AMBULATORY_CARE_PROVIDER_SITE_OTHER): Payer: Self-pay | Admitting: Physician Assistant

## 2018-06-03 DIAGNOSIS — M1711 Unilateral primary osteoarthritis, right knee: Secondary | ICD-10-CM | POA: Diagnosis not present

## 2018-06-03 MED ORDER — HYALURONAN 88 MG/4ML IX SOSY
88.0000 mg | PREFILLED_SYRINGE | INTRA_ARTICULAR | Status: AC | PRN
  Administered 2018-06-03: 88 mg via INTRA_ARTICULAR

## 2018-06-03 NOTE — Progress Notes (Signed)
   Procedure Note  Patient: Tracy [name]             Date of Birth: 11-12-1946           MRN: [id number]             Visit Date: 06/03/2018 HPI: Tracy [name] is a 72 year old female is known to Dr. Ninfa Linden service comes in today for a Monovisc injection in her right knee.  She is given a cortisone injection 03/29/2018 the cortisone injections really helped with her knee.  She is having no mechanical symptoms of the right knee.  Having minimal discomfort.  Physical exam: Right knee good range of motion.  No effusion abnormal warmth or erythema. Procedures: Visit Diagnoses: No diagnosis found.  Large Joint Inj: R knee on 06/03/2018 2:47 PM Indications: pain Details: 22 G 1.5 in needle, superolateral approach  Arthrogram: No  Medications: 88 mg Hyaluronan 88 MG/4ML Outcome: tolerated well, no immediate complications Procedure, treatment alternatives, risks and benefits explained, specific risks discussed. Consent was given by the patient. Immediately prior to procedure a time out was called to verify the correct patient, procedure, equipment, support staff and site/side marked as required. Patient was prepped and draped in the usual sterile fashion.     Plan: Discussed with her that she can have cortisone injection as early as July 22 if need be.  Monovisc injection in another 6 months.  She will continue to work on strengthening any weight loss.  Follow-up with [place] on an as-needed basis.

## 2018-06-25 DIAGNOSIS — E042 Nontoxic multinodular goiter: Secondary | ICD-10-CM | POA: Diagnosis not present

## 2018-06-25 DIAGNOSIS — F322 Major depressive disorder, single episode, severe without psychotic features: Secondary | ICD-10-CM | POA: Diagnosis not present

## 2018-06-25 DIAGNOSIS — G4700 Insomnia, unspecified: Secondary | ICD-10-CM | POA: Diagnosis not present

## 2018-06-25 DIAGNOSIS — E1169 Type 2 diabetes mellitus with other specified complication: Secondary | ICD-10-CM | POA: Diagnosis not present

## 2018-06-25 DIAGNOSIS — M8580 Other specified disorders of bone density and structure, unspecified site: Secondary | ICD-10-CM | POA: Diagnosis not present

## 2018-06-25 DIAGNOSIS — Z0000 Encounter for general adult medical examination without abnormal findings: Secondary | ICD-10-CM | POA: Diagnosis not present

## 2018-06-25 DIAGNOSIS — E669 Obesity, unspecified: Secondary | ICD-10-CM | POA: Diagnosis not present

## 2018-06-25 DIAGNOSIS — E782 Mixed hyperlipidemia: Secondary | ICD-10-CM | POA: Diagnosis not present

## 2018-06-25 DIAGNOSIS — I10 Essential (primary) hypertension: Secondary | ICD-10-CM | POA: Diagnosis not present

## 2018-08-24 DIAGNOSIS — Z23 Encounter for immunization: Secondary | ICD-10-CM | POA: Diagnosis not present

## 2018-09-27 DIAGNOSIS — S92505A Nondisplaced unspecified fracture of left lesser toe(s), initial encounter for closed fracture: Secondary | ICD-10-CM | POA: Diagnosis not present

## 2018-12-23 DIAGNOSIS — Z6833 Body mass index (BMI) 33.0-33.9, adult: Secondary | ICD-10-CM | POA: Diagnosis not present

## 2018-12-23 DIAGNOSIS — E1169 Type 2 diabetes mellitus with other specified complication: Secondary | ICD-10-CM | POA: Diagnosis not present

## 2018-12-23 DIAGNOSIS — E119 Type 2 diabetes mellitus without complications: Secondary | ICD-10-CM | POA: Diagnosis not present

## 2018-12-23 DIAGNOSIS — G4700 Insomnia, unspecified: Secondary | ICD-10-CM | POA: Diagnosis not present

## 2018-12-23 DIAGNOSIS — E669 Obesity, unspecified: Secondary | ICD-10-CM | POA: Diagnosis not present

## 2018-12-23 DIAGNOSIS — I10 Essential (primary) hypertension: Secondary | ICD-10-CM | POA: Diagnosis not present

## 2018-12-23 DIAGNOSIS — E782 Mixed hyperlipidemia: Secondary | ICD-10-CM | POA: Diagnosis not present

## 2019-04-28 DIAGNOSIS — H26493 Other secondary cataract, bilateral: Secondary | ICD-10-CM | POA: Diagnosis not present

## 2019-04-28 DIAGNOSIS — Z961 Presence of intraocular lens: Secondary | ICD-10-CM | POA: Diagnosis not present

## 2019-04-28 DIAGNOSIS — H43813 Vitreous degeneration, bilateral: Secondary | ICD-10-CM | POA: Diagnosis not present

## 2019-04-28 DIAGNOSIS — H527 Unspecified disorder of refraction: Secondary | ICD-10-CM | POA: Diagnosis not present

## 2019-04-28 DIAGNOSIS — H353132 Nonexudative age-related macular degeneration, bilateral, intermediate dry stage: Secondary | ICD-10-CM | POA: Diagnosis not present

## 2019-05-05 DIAGNOSIS — H26492 Other secondary cataract, left eye: Secondary | ICD-10-CM | POA: Diagnosis not present

## 2019-06-23 DIAGNOSIS — D649 Anemia, unspecified: Secondary | ICD-10-CM | POA: Diagnosis not present

## 2019-06-23 DIAGNOSIS — E042 Nontoxic multinodular goiter: Secondary | ICD-10-CM | POA: Diagnosis not present

## 2019-06-23 DIAGNOSIS — I10 Essential (primary) hypertension: Secondary | ICD-10-CM | POA: Diagnosis not present

## 2019-06-23 DIAGNOSIS — E782 Mixed hyperlipidemia: Secondary | ICD-10-CM | POA: Diagnosis not present

## 2019-06-23 DIAGNOSIS — E1169 Type 2 diabetes mellitus with other specified complication: Secondary | ICD-10-CM | POA: Diagnosis not present

## 2019-06-29 DIAGNOSIS — I10 Essential (primary) hypertension: Secondary | ICD-10-CM | POA: Diagnosis not present

## 2019-06-29 DIAGNOSIS — F322 Major depressive disorder, single episode, severe without psychotic features: Secondary | ICD-10-CM | POA: Diagnosis not present

## 2019-06-29 DIAGNOSIS — Z0000 Encounter for general adult medical examination without abnormal findings: Secondary | ICD-10-CM | POA: Diagnosis not present

## 2019-06-29 DIAGNOSIS — E119 Type 2 diabetes mellitus without complications: Secondary | ICD-10-CM | POA: Diagnosis not present

## 2019-06-29 DIAGNOSIS — N3281 Overactive bladder: Secondary | ICD-10-CM | POA: Diagnosis not present

## 2019-06-29 DIAGNOSIS — G4700 Insomnia, unspecified: Secondary | ICD-10-CM | POA: Diagnosis not present

## 2019-06-29 DIAGNOSIS — E669 Obesity, unspecified: Secondary | ICD-10-CM | POA: Diagnosis not present

## 2019-06-29 DIAGNOSIS — E1169 Type 2 diabetes mellitus with other specified complication: Secondary | ICD-10-CM | POA: Diagnosis not present

## 2019-06-29 DIAGNOSIS — M8580 Other specified disorders of bone density and structure, unspecified site: Secondary | ICD-10-CM | POA: Diagnosis not present

## 2019-06-29 DIAGNOSIS — E782 Mixed hyperlipidemia: Secondary | ICD-10-CM | POA: Diagnosis not present

## 2019-11-18 ENCOUNTER — Other Ambulatory Visit: Payer: Self-pay | Admitting: Family Medicine

## 2019-11-18 DIAGNOSIS — M8580 Other specified disorders of bone density and structure, unspecified site: Secondary | ICD-10-CM

## 2019-11-18 DIAGNOSIS — Z1231 Encounter for screening mammogram for malignant neoplasm of breast: Secondary | ICD-10-CM

## 2019-12-28 DIAGNOSIS — E1169 Type 2 diabetes mellitus with other specified complication: Secondary | ICD-10-CM | POA: Diagnosis not present

## 2019-12-28 DIAGNOSIS — I10 Essential (primary) hypertension: Secondary | ICD-10-CM | POA: Diagnosis not present

## 2019-12-28 DIAGNOSIS — E782 Mixed hyperlipidemia: Secondary | ICD-10-CM | POA: Diagnosis not present

## 2020-01-04 ENCOUNTER — Ambulatory Visit: Payer: Medicare HMO

## 2020-01-13 ENCOUNTER — Ambulatory Visit: Payer: Medicare HMO

## 2020-01-25 ENCOUNTER — Ambulatory Visit: Payer: Medicare HMO

## 2020-02-16 ENCOUNTER — Ambulatory Visit
Admission: RE | Admit: 2020-02-16 | Discharge: 2020-02-16 | Disposition: A | Discharge status: Home / Self Care | Payer: Medicare HMO | Source: Ambulatory Visit | Attending: Family Medicine | Admitting: Family Medicine

## 2020-02-16 ENCOUNTER — Other Ambulatory Visit: Payer: Self-pay

## 2020-02-16 DIAGNOSIS — M8580 Other specified disorders of bone density and structure, unspecified site: Secondary | ICD-10-CM

## 2020-02-16 DIAGNOSIS — Z780 Asymptomatic menopausal state: Secondary | ICD-10-CM | POA: Diagnosis not present

## 2020-02-16 DIAGNOSIS — Z1231 Encounter for screening mammogram for malignant neoplasm of breast: Secondary | ICD-10-CM

## 2020-02-16 DIAGNOSIS — M8589 Other specified disorders of bone density and structure, multiple sites: Secondary | ICD-10-CM | POA: Diagnosis not present

## 2020-06-04 DIAGNOSIS — H5213 Myopia, bilateral: Secondary | ICD-10-CM | POA: Diagnosis not present

## 2020-06-04 DIAGNOSIS — E139 Other specified diabetes mellitus without complications: Secondary | ICD-10-CM | POA: Diagnosis not present

## 2020-06-04 DIAGNOSIS — E7800 Pure hypercholesterolemia, unspecified: Secondary | ICD-10-CM | POA: Diagnosis not present

## 2020-06-04 DIAGNOSIS — I10 Essential (primary) hypertension: Secondary | ICD-10-CM | POA: Diagnosis not present

## 2020-07-26 DIAGNOSIS — D649 Anemia, unspecified: Secondary | ICD-10-CM | POA: Diagnosis not present

## 2020-07-26 DIAGNOSIS — E782 Mixed hyperlipidemia: Secondary | ICD-10-CM | POA: Diagnosis not present

## 2020-07-26 DIAGNOSIS — G4700 Insomnia, unspecified: Secondary | ICD-10-CM | POA: Diagnosis not present

## 2020-07-26 DIAGNOSIS — M8580 Other specified disorders of bone density and structure, unspecified site: Secondary | ICD-10-CM | POA: Diagnosis not present

## 2020-07-26 DIAGNOSIS — N3281 Overactive bladder: Secondary | ICD-10-CM | POA: Diagnosis not present

## 2020-07-26 DIAGNOSIS — J301 Allergic rhinitis due to pollen: Secondary | ICD-10-CM | POA: Diagnosis not present

## 2020-07-26 DIAGNOSIS — I10 Essential (primary) hypertension: Secondary | ICD-10-CM | POA: Diagnosis not present

## 2020-07-26 DIAGNOSIS — K219 Gastro-esophageal reflux disease without esophagitis: Secondary | ICD-10-CM | POA: Diagnosis not present

## 2020-07-26 DIAGNOSIS — E042 Nontoxic multinodular goiter: Secondary | ICD-10-CM | POA: Diagnosis not present

## 2020-07-26 DIAGNOSIS — Z0000 Encounter for general adult medical examination without abnormal findings: Secondary | ICD-10-CM | POA: Diagnosis not present

## 2020-07-26 DIAGNOSIS — F322 Major depressive disorder, single episode, severe without psychotic features: Secondary | ICD-10-CM | POA: Diagnosis not present

## 2020-07-26 DIAGNOSIS — E1169 Type 2 diabetes mellitus with other specified complication: Secondary | ICD-10-CM | POA: Diagnosis not present

## 2020-07-27 ENCOUNTER — Other Ambulatory Visit: Payer: Self-pay | Admitting: Family Medicine

## 2020-07-27 DIAGNOSIS — E049 Nontoxic goiter, unspecified: Secondary | ICD-10-CM

## 2020-08-02 ENCOUNTER — Ambulatory Visit
Admission: RE | Admit: 2020-08-02 | Discharge: 2020-08-02 | Disposition: A | Discharge status: Home / Self Care | Payer: Medicare HMO | Source: Ambulatory Visit | Attending: Family Medicine | Admitting: Family Medicine

## 2020-08-02 DIAGNOSIS — E041 Nontoxic single thyroid nodule: Secondary | ICD-10-CM | POA: Diagnosis not present

## 2020-08-02 DIAGNOSIS — E049 Nontoxic goiter, unspecified: Secondary | ICD-10-CM

## 2020-12-11 DIAGNOSIS — Z1159 Encounter for screening for other viral diseases: Secondary | ICD-10-CM | POA: Diagnosis not present

## 2020-12-13 DIAGNOSIS — D123 Benign neoplasm of transverse colon: Secondary | ICD-10-CM | POA: Diagnosis not present

## 2020-12-13 DIAGNOSIS — Z86010 Personal history of colonic polyps: Secondary | ICD-10-CM | POA: Diagnosis not present

## 2020-12-13 DIAGNOSIS — D124 Benign neoplasm of descending colon: Secondary | ICD-10-CM | POA: Diagnosis not present

## 2020-12-18 DIAGNOSIS — D123 Benign neoplasm of transverse colon: Secondary | ICD-10-CM | POA: Diagnosis not present

## 2020-12-18 DIAGNOSIS — D124 Benign neoplasm of descending colon: Secondary | ICD-10-CM | POA: Diagnosis not present

## 2021-02-04 DIAGNOSIS — E119 Type 2 diabetes mellitus without complications: Secondary | ICD-10-CM | POA: Diagnosis not present

## 2021-02-04 DIAGNOSIS — E1169 Type 2 diabetes mellitus with other specified complication: Secondary | ICD-10-CM | POA: Diagnosis not present

## 2021-02-04 DIAGNOSIS — I10 Essential (primary) hypertension: Secondary | ICD-10-CM | POA: Diagnosis not present

## 2021-02-04 DIAGNOSIS — E669 Obesity, unspecified: Secondary | ICD-10-CM | POA: Diagnosis not present

## 2021-02-04 DIAGNOSIS — E782 Mixed hyperlipidemia: Secondary | ICD-10-CM | POA: Diagnosis not present

## 2021-07-31 DIAGNOSIS — I10 Essential (primary) hypertension: Secondary | ICD-10-CM | POA: Diagnosis not present

## 2021-07-31 DIAGNOSIS — J301 Allergic rhinitis due to pollen: Secondary | ICD-10-CM | POA: Diagnosis not present

## 2021-07-31 DIAGNOSIS — E119 Type 2 diabetes mellitus without complications: Secondary | ICD-10-CM | POA: Diagnosis not present

## 2021-07-31 DIAGNOSIS — G4700 Insomnia, unspecified: Secondary | ICD-10-CM | POA: Diagnosis not present

## 2021-07-31 DIAGNOSIS — Z0000 Encounter for general adult medical examination without abnormal findings: Secondary | ICD-10-CM | POA: Diagnosis not present

## 2021-07-31 DIAGNOSIS — N3281 Overactive bladder: Secondary | ICD-10-CM | POA: Diagnosis not present

## 2021-07-31 DIAGNOSIS — E782 Mixed hyperlipidemia: Secondary | ICD-10-CM | POA: Diagnosis not present

## 2021-07-31 DIAGNOSIS — E1169 Type 2 diabetes mellitus with other specified complication: Secondary | ICD-10-CM | POA: Diagnosis not present

## 2021-07-31 DIAGNOSIS — E042 Nontoxic multinodular goiter: Secondary | ICD-10-CM | POA: Diagnosis not present

## 2021-07-31 DIAGNOSIS — M8580 Other specified disorders of bone density and structure, unspecified site: Secondary | ICD-10-CM | POA: Diagnosis not present

## 2021-07-31 DIAGNOSIS — F3340 Major depressive disorder, recurrent, in remission, unspecified: Secondary | ICD-10-CM | POA: Diagnosis not present

## 2021-08-01 ENCOUNTER — Other Ambulatory Visit: Payer: Self-pay | Admitting: Family Medicine

## 2021-08-01 DIAGNOSIS — E042 Nontoxic multinodular goiter: Secondary | ICD-10-CM

## 2021-08-02 ENCOUNTER — Other Ambulatory Visit: Payer: Self-pay | Admitting: Family Medicine

## 2021-08-02 DIAGNOSIS — Z1231 Encounter for screening mammogram for malignant neoplasm of breast: Secondary | ICD-10-CM

## 2021-08-14 ENCOUNTER — Ambulatory Visit
Admission: RE | Admit: 2021-08-14 | Discharge: 2021-08-14 | Disposition: A | Discharge status: Home / Self Care | Payer: Medicare HMO | Source: Ambulatory Visit | Attending: Family Medicine | Admitting: Family Medicine

## 2021-08-14 ENCOUNTER — Other Ambulatory Visit: Payer: Self-pay

## 2021-08-14 DIAGNOSIS — E042 Nontoxic multinodular goiter: Secondary | ICD-10-CM

## 2021-09-02 DIAGNOSIS — H353230 Exudative age-related macular degeneration, bilateral, stage unspecified: Secondary | ICD-10-CM | POA: Diagnosis not present

## 2021-09-20 DIAGNOSIS — H353132 Nonexudative age-related macular degeneration, bilateral, intermediate dry stage: Secondary | ICD-10-CM | POA: Diagnosis not present

## 2021-09-20 DIAGNOSIS — H35033 Hypertensive retinopathy, bilateral: Secondary | ICD-10-CM | POA: Diagnosis not present

## 2021-09-20 DIAGNOSIS — H43813 Vitreous degeneration, bilateral: Secondary | ICD-10-CM | POA: Diagnosis not present

## 2021-10-09 ENCOUNTER — Ambulatory Visit
Admission: RE | Admit: 2021-10-09 | Discharge: 2021-10-09 | Disposition: A | Discharge status: Home / Self Care | Payer: Medicare HMO | Source: Ambulatory Visit | Attending: Family Medicine | Admitting: Family Medicine

## 2021-10-09 ENCOUNTER — Other Ambulatory Visit: Payer: Self-pay

## 2021-10-09 DIAGNOSIS — Z1231 Encounter for screening mammogram for malignant neoplasm of breast: Secondary | ICD-10-CM

## 2021-10-18 DIAGNOSIS — H43813 Vitreous degeneration, bilateral: Secondary | ICD-10-CM | POA: Diagnosis not present

## 2021-10-18 DIAGNOSIS — H35033 Hypertensive retinopathy, bilateral: Secondary | ICD-10-CM | POA: Diagnosis not present

## 2021-10-18 DIAGNOSIS — H353132 Nonexudative age-related macular degeneration, bilateral, intermediate dry stage: Secondary | ICD-10-CM | POA: Diagnosis not present

## 2022-01-31 DIAGNOSIS — E782 Mixed hyperlipidemia: Secondary | ICD-10-CM | POA: Diagnosis not present

## 2022-01-31 DIAGNOSIS — E1169 Type 2 diabetes mellitus with other specified complication: Secondary | ICD-10-CM | POA: Diagnosis not present

## 2022-01-31 DIAGNOSIS — E119 Type 2 diabetes mellitus without complications: Secondary | ICD-10-CM | POA: Diagnosis not present

## 2022-01-31 DIAGNOSIS — I10 Essential (primary) hypertension: Secondary | ICD-10-CM | POA: Diagnosis not present

## 2022-02-14 DIAGNOSIS — H353132 Nonexudative age-related macular degeneration, bilateral, intermediate dry stage: Secondary | ICD-10-CM | POA: Diagnosis not present

## 2022-02-14 DIAGNOSIS — H43813 Vitreous degeneration, bilateral: Secondary | ICD-10-CM | POA: Diagnosis not present

## 2022-02-14 DIAGNOSIS — H35033 Hypertensive retinopathy, bilateral: Secondary | ICD-10-CM | POA: Diagnosis not present

## 2022-08-18 DIAGNOSIS — H353132 Nonexudative age-related macular degeneration, bilateral, intermediate dry stage: Secondary | ICD-10-CM | POA: Diagnosis not present

## 2022-08-18 DIAGNOSIS — H43813 Vitreous degeneration, bilateral: Secondary | ICD-10-CM | POA: Diagnosis not present

## 2022-08-18 DIAGNOSIS — H35033 Hypertensive retinopathy, bilateral: Secondary | ICD-10-CM | POA: Diagnosis not present

## 2022-08-27 DIAGNOSIS — Z0000 Encounter for general adult medical examination without abnormal findings: Secondary | ICD-10-CM | POA: Diagnosis not present

## 2022-08-27 DIAGNOSIS — I10 Essential (primary) hypertension: Secondary | ICD-10-CM | POA: Diagnosis not present

## 2022-08-27 DIAGNOSIS — J301 Allergic rhinitis due to pollen: Secondary | ICD-10-CM | POA: Diagnosis not present

## 2022-08-27 DIAGNOSIS — N3281 Overactive bladder: Secondary | ICD-10-CM | POA: Diagnosis not present

## 2022-08-27 DIAGNOSIS — E1169 Type 2 diabetes mellitus with other specified complication: Secondary | ICD-10-CM | POA: Diagnosis not present

## 2022-08-27 DIAGNOSIS — M8580 Other specified disorders of bone density and structure, unspecified site: Secondary | ICD-10-CM | POA: Diagnosis not present

## 2022-08-27 DIAGNOSIS — E782 Mixed hyperlipidemia: Secondary | ICD-10-CM | POA: Diagnosis not present

## 2022-08-27 DIAGNOSIS — F3340 Major depressive disorder, recurrent, in remission, unspecified: Secondary | ICD-10-CM | POA: Diagnosis not present

## 2022-08-27 DIAGNOSIS — G4700 Insomnia, unspecified: Secondary | ICD-10-CM | POA: Diagnosis not present

## 2022-08-28 ENCOUNTER — Other Ambulatory Visit: Payer: Self-pay | Admitting: Family Medicine

## 2022-08-28 DIAGNOSIS — M8580 Other specified disorders of bone density and structure, unspecified site: Secondary | ICD-10-CM

## 2022-09-04 DIAGNOSIS — H16223 Keratoconjunctivitis sicca, not specified as Sjogren's, bilateral: Secondary | ICD-10-CM | POA: Diagnosis not present

## 2022-09-23 ENCOUNTER — Ambulatory Visit (HOSPITAL_BASED_OUTPATIENT_CLINIC_OR_DEPARTMENT_OTHER): Payer: Medicare HMO | Admitting: Physical Therapy

## 2022-09-23 DIAGNOSIS — U071 COVID-19: Secondary | ICD-10-CM | POA: Diagnosis not present

## 2023-02-16 DIAGNOSIS — H35033 Hypertensive retinopathy, bilateral: Secondary | ICD-10-CM | POA: Diagnosis not present

## 2023-02-16 DIAGNOSIS — H43813 Vitreous degeneration, bilateral: Secondary | ICD-10-CM | POA: Diagnosis not present

## 2023-02-16 DIAGNOSIS — H353132 Nonexudative age-related macular degeneration, bilateral, intermediate dry stage: Secondary | ICD-10-CM | POA: Diagnosis not present

## 2023-02-25 DIAGNOSIS — I10 Essential (primary) hypertension: Secondary | ICD-10-CM | POA: Diagnosis not present

## 2023-02-25 DIAGNOSIS — N182 Chronic kidney disease, stage 2 (mild): Secondary | ICD-10-CM | POA: Diagnosis not present

## 2023-02-25 DIAGNOSIS — E1122 Type 2 diabetes mellitus with diabetic chronic kidney disease: Secondary | ICD-10-CM | POA: Diagnosis not present

## 2023-02-25 DIAGNOSIS — E782 Mixed hyperlipidemia: Secondary | ICD-10-CM | POA: Diagnosis not present

## 2023-02-25 DIAGNOSIS — Z23 Encounter for immunization: Secondary | ICD-10-CM | POA: Diagnosis not present

## 2023-06-29 ENCOUNTER — Other Ambulatory Visit (INDEPENDENT_AMBULATORY_CARE_PROVIDER_SITE_OTHER): Payer: Medicare HMO

## 2023-06-29 ENCOUNTER — Ambulatory Visit: Payer: Medicare HMO | Admitting: Orthopaedic Surgery

## 2023-06-29 ENCOUNTER — Other Ambulatory Visit: Payer: Self-pay

## 2023-06-29 DIAGNOSIS — G8929 Other chronic pain: Secondary | ICD-10-CM

## 2023-06-29 DIAGNOSIS — M25562 Pain in left knee: Secondary | ICD-10-CM

## 2023-06-29 MED ORDER — LIDOCAINE HCL 1 % IJ SOLN
3.0000 mL | INTRAMUSCULAR | Status: AC | PRN
  Administered 2023-06-29: 3 mL

## 2023-06-29 MED ORDER — METHYLPREDNISOLONE ACETATE 40 MG/ML IJ SUSP
40.0000 mg | INTRAMUSCULAR | Status: AC | PRN
  Administered 2023-06-29: 40 mg via INTRA_ARTICULAR

## 2023-06-29 NOTE — Progress Notes (Signed)
The patient is an active 77 year old female that I have not seen in a very long period of time.  She comes in using a cane because she states that she has fallen 2 or 3 times over the last few weeks.  She denies any specific injury but she is requesting a steroid injection in her left knee.  We have injected that knee in the past.  Examination of her left knee shows no effusion.  There is some bruising of the knee anteriorly.  She has full range of motion of the knee and feels ligamentously stable.  2 views left knee show no evidence of fracture and there is only minimal arthritic changes.  She would definitely benefit from quad training exercises on both lower extremities as well as working on her balance and coordination.  She can continue to use her cane and I have recommended at least a knee sleeve for some support.  We will certainly set her up for physical therapy as an outpatient for any modalities that the therapist can use to strengthen her knees and legs in general.  They need to work on her balance and coordination.  I did give her handout on knee conditioning exercises.  I did place a steroid injection in her left knee without difficulty.  We will see her back in 2 months after course of physical therapy.  All questions and concerns were addressed and answered.     Procedure Note  Patient: Tracy [name]             Date of Birth: 1946/06/08           MRN: [id number]             Visit Date: 06/29/2023  Procedures: Visit Diagnoses:  1. Chronic pain of left knee     Large Joint Inj: L knee on 06/29/2023 10:38 AM Indications: diagnostic evaluation and pain Details: 22 G 1.5 in needle, superolateral approach  Arthrogram: No  Medications: 3 mL lidocaine 1 %; 40 mg methylPREDNISolone acetate 40 MG/ML Outcome: tolerated well, no immediate complications Procedure, treatment alternatives, risks and benefits explained, specific risks discussed. Consent was given by the patient.  Immediately prior to procedure a time out was called to verify the correct patient, procedure, equipment, support staff and site/side marked as required. Patient was prepped and draped in the usual sterile fashion.

## 2023-08-11 ENCOUNTER — Ambulatory Visit (HOSPITAL_BASED_OUTPATIENT_CLINIC_OR_DEPARTMENT_OTHER): Payer: Medicare HMO | Attending: Orthopaedic Surgery | Admitting: Physical Therapy

## 2023-08-24 DIAGNOSIS — H353132 Nonexudative age-related macular degeneration, bilateral, intermediate dry stage: Secondary | ICD-10-CM | POA: Diagnosis not present

## 2023-08-24 DIAGNOSIS — H35033 Hypertensive retinopathy, bilateral: Secondary | ICD-10-CM | POA: Diagnosis not present

## 2023-08-24 DIAGNOSIS — H43813 Vitreous degeneration, bilateral: Secondary | ICD-10-CM | POA: Diagnosis not present

## 2023-08-31 ENCOUNTER — Ambulatory Visit: Payer: Medicare HMO | Admitting: Orthopaedic Surgery

## 2023-09-02 DIAGNOSIS — E1122 Type 2 diabetes mellitus with diabetic chronic kidney disease: Secondary | ICD-10-CM | POA: Diagnosis not present

## 2023-09-02 DIAGNOSIS — Z0000 Encounter for general adult medical examination without abnormal findings: Secondary | ICD-10-CM | POA: Diagnosis not present

## 2023-09-02 DIAGNOSIS — N3281 Overactive bladder: Secondary | ICD-10-CM | POA: Diagnosis not present

## 2023-09-02 DIAGNOSIS — M8580 Other specified disorders of bone density and structure, unspecified site: Secondary | ICD-10-CM | POA: Diagnosis not present

## 2023-09-02 DIAGNOSIS — I10 Essential (primary) hypertension: Secondary | ICD-10-CM | POA: Diagnosis not present

## 2023-09-02 DIAGNOSIS — E782 Mixed hyperlipidemia: Secondary | ICD-10-CM | POA: Diagnosis not present

## 2023-09-02 DIAGNOSIS — F3340 Major depressive disorder, recurrent, in remission, unspecified: Secondary | ICD-10-CM | POA: Diagnosis not present

## 2023-09-02 DIAGNOSIS — N182 Chronic kidney disease, stage 2 (mild): Secondary | ICD-10-CM | POA: Diagnosis not present

## 2023-09-02 DIAGNOSIS — G4700 Insomnia, unspecified: Secondary | ICD-10-CM | POA: Diagnosis not present

## 2023-09-04 ENCOUNTER — Other Ambulatory Visit: Payer: Self-pay | Admitting: Family Medicine

## 2023-09-04 DIAGNOSIS — E042 Nontoxic multinodular goiter: Secondary | ICD-10-CM

## 2023-09-04 DIAGNOSIS — Z1231 Encounter for screening mammogram for malignant neoplasm of breast: Secondary | ICD-10-CM

## 2023-09-05 ENCOUNTER — Other Ambulatory Visit: Payer: Self-pay | Admitting: Family Medicine

## 2023-09-05 DIAGNOSIS — M8580 Other specified disorders of bone density and structure, unspecified site: Secondary | ICD-10-CM

## 2023-09-08 ENCOUNTER — Inpatient Hospital Stay
Admission: RE | Admit: 2023-09-08 | Discharge: 2023-09-08 | Discharge status: Home / Self Care | Payer: Medicare HMO | Source: Ambulatory Visit | Attending: Family Medicine | Admitting: Family Medicine

## 2023-09-08 DIAGNOSIS — E042 Nontoxic multinodular goiter: Secondary | ICD-10-CM

## 2023-10-02 DIAGNOSIS — E119 Type 2 diabetes mellitus without complications: Secondary | ICD-10-CM | POA: Diagnosis not present

## 2023-10-02 DIAGNOSIS — H353132 Nonexudative age-related macular degeneration, bilateral, intermediate dry stage: Secondary | ICD-10-CM | POA: Diagnosis not present

## 2023-10-02 DIAGNOSIS — Z961 Presence of intraocular lens: Secondary | ICD-10-CM | POA: Diagnosis not present

## 2023-10-02 DIAGNOSIS — Z0100 Encounter for examination of eyes and vision without abnormal findings: Secondary | ICD-10-CM | POA: Diagnosis not present

## 2023-10-02 DIAGNOSIS — H5213 Myopia, bilateral: Secondary | ICD-10-CM | POA: Diagnosis not present

## 2024-02-03 DIAGNOSIS — M436 Torticollis: Secondary | ICD-10-CM | POA: Diagnosis not present

## 2024-02-22 DIAGNOSIS — H353132 Nonexudative age-related macular degeneration, bilateral, intermediate dry stage: Secondary | ICD-10-CM | POA: Diagnosis not present

## 2024-02-22 DIAGNOSIS — H35033 Hypertensive retinopathy, bilateral: Secondary | ICD-10-CM | POA: Diagnosis not present

## 2024-02-22 DIAGNOSIS — H43813 Vitreous degeneration, bilateral: Secondary | ICD-10-CM | POA: Diagnosis not present

## 2024-03-02 DIAGNOSIS — E1122 Type 2 diabetes mellitus with diabetic chronic kidney disease: Secondary | ICD-10-CM | POA: Diagnosis not present

## 2024-03-02 DIAGNOSIS — M6289 Other specified disorders of muscle: Secondary | ICD-10-CM | POA: Diagnosis not present

## 2024-03-02 DIAGNOSIS — N182 Chronic kidney disease, stage 2 (mild): Secondary | ICD-10-CM | POA: Diagnosis not present

## 2024-03-02 DIAGNOSIS — I10 Essential (primary) hypertension: Secondary | ICD-10-CM | POA: Diagnosis not present

## 2024-03-02 DIAGNOSIS — E782 Mixed hyperlipidemia: Secondary | ICD-10-CM | POA: Diagnosis not present

## 2024-05-23 DIAGNOSIS — E1122 Type 2 diabetes mellitus with diabetic chronic kidney disease: Secondary | ICD-10-CM | POA: Diagnosis not present

## 2024-05-23 DIAGNOSIS — N182 Chronic kidney disease, stage 2 (mild): Secondary | ICD-10-CM | POA: Diagnosis not present

## 2024-05-23 DIAGNOSIS — I10 Essential (primary) hypertension: Secondary | ICD-10-CM | POA: Diagnosis not present

## 2024-07-04 DIAGNOSIS — N182 Chronic kidney disease, stage 2 (mild): Secondary | ICD-10-CM | POA: Diagnosis not present

## 2024-07-04 DIAGNOSIS — I10 Essential (primary) hypertension: Secondary | ICD-10-CM | POA: Diagnosis not present

## 2024-07-04 DIAGNOSIS — E1122 Type 2 diabetes mellitus with diabetic chronic kidney disease: Secondary | ICD-10-CM | POA: Diagnosis not present

## 2024-07-07 DIAGNOSIS — E782 Mixed hyperlipidemia: Secondary | ICD-10-CM | POA: Diagnosis not present

## 2024-07-07 DIAGNOSIS — E1122 Type 2 diabetes mellitus with diabetic chronic kidney disease: Secondary | ICD-10-CM | POA: Diagnosis not present

## 2024-07-07 DIAGNOSIS — N182 Chronic kidney disease, stage 2 (mild): Secondary | ICD-10-CM | POA: Diagnosis not present

## 2024-07-07 DIAGNOSIS — I10 Essential (primary) hypertension: Secondary | ICD-10-CM | POA: Diagnosis not present

## 2024-07-07 DIAGNOSIS — F3340 Major depressive disorder, recurrent, in remission, unspecified: Secondary | ICD-10-CM | POA: Diagnosis not present

## 2024-08-03 DIAGNOSIS — E1122 Type 2 diabetes mellitus with diabetic chronic kidney disease: Secondary | ICD-10-CM | POA: Diagnosis not present

## 2024-08-03 DIAGNOSIS — I10 Essential (primary) hypertension: Secondary | ICD-10-CM | POA: Diagnosis not present

## 2024-08-03 DIAGNOSIS — N182 Chronic kidney disease, stage 2 (mild): Secondary | ICD-10-CM | POA: Diagnosis not present

## 2024-08-07 DIAGNOSIS — F3340 Major depressive disorder, recurrent, in remission, unspecified: Secondary | ICD-10-CM | POA: Diagnosis not present

## 2024-08-07 DIAGNOSIS — E782 Mixed hyperlipidemia: Secondary | ICD-10-CM | POA: Diagnosis not present

## 2024-08-07 DIAGNOSIS — N182 Chronic kidney disease, stage 2 (mild): Secondary | ICD-10-CM | POA: Diagnosis not present

## 2024-08-07 DIAGNOSIS — E1122 Type 2 diabetes mellitus with diabetic chronic kidney disease: Secondary | ICD-10-CM | POA: Diagnosis not present

## 2024-08-07 DIAGNOSIS — I10 Essential (primary) hypertension: Secondary | ICD-10-CM | POA: Diagnosis not present

## 2024-08-29 DIAGNOSIS — H35033 Hypertensive retinopathy, bilateral: Secondary | ICD-10-CM | POA: Diagnosis not present

## 2024-08-29 DIAGNOSIS — H353132 Nonexudative age-related macular degeneration, bilateral, intermediate dry stage: Secondary | ICD-10-CM | POA: Diagnosis not present

## 2024-08-29 DIAGNOSIS — H43813 Vitreous degeneration, bilateral: Secondary | ICD-10-CM | POA: Diagnosis not present

## 2024-09-02 DIAGNOSIS — E782 Mixed hyperlipidemia: Secondary | ICD-10-CM | POA: Diagnosis not present

## 2024-09-02 DIAGNOSIS — E1122 Type 2 diabetes mellitus with diabetic chronic kidney disease: Secondary | ICD-10-CM | POA: Diagnosis not present

## 2024-09-05 ENCOUNTER — Other Ambulatory Visit: Payer: Self-pay | Admitting: Family Medicine

## 2024-09-05 DIAGNOSIS — Z1231 Encounter for screening mammogram for malignant neoplasm of breast: Secondary | ICD-10-CM

## 2024-09-06 DIAGNOSIS — I10 Essential (primary) hypertension: Secondary | ICD-10-CM | POA: Diagnosis not present

## 2024-09-06 DIAGNOSIS — F3340 Major depressive disorder, recurrent, in remission, unspecified: Secondary | ICD-10-CM | POA: Diagnosis not present

## 2024-09-06 DIAGNOSIS — E782 Mixed hyperlipidemia: Secondary | ICD-10-CM | POA: Diagnosis not present

## 2024-09-06 DIAGNOSIS — N182 Chronic kidney disease, stage 2 (mild): Secondary | ICD-10-CM | POA: Diagnosis not present

## 2024-09-16 ENCOUNTER — Ambulatory Visit
Admission: RE | Admit: 2024-09-16 | Discharge: 2024-09-16 | Disposition: A | Discharge status: Home / Self Care | Source: Ambulatory Visit | Attending: Family Medicine | Admitting: Family Medicine

## 2024-09-16 DIAGNOSIS — Z1231 Encounter for screening mammogram for malignant neoplasm of breast: Secondary | ICD-10-CM | POA: Diagnosis not present

## 2024-10-07 DIAGNOSIS — F3340 Major depressive disorder, recurrent, in remission, unspecified: Secondary | ICD-10-CM | POA: Diagnosis not present

## 2024-10-07 DIAGNOSIS — N182 Chronic kidney disease, stage 2 (mild): Secondary | ICD-10-CM | POA: Diagnosis not present

## 2024-10-07 DIAGNOSIS — I10 Essential (primary) hypertension: Secondary | ICD-10-CM | POA: Diagnosis not present

## 2024-10-07 DIAGNOSIS — E782 Mixed hyperlipidemia: Secondary | ICD-10-CM | POA: Diagnosis not present

## 2024-10-31 DIAGNOSIS — I10 Essential (primary) hypertension: Secondary | ICD-10-CM | POA: Diagnosis not present

## 2024-10-31 DIAGNOSIS — N182 Chronic kidney disease, stage 2 (mild): Secondary | ICD-10-CM | POA: Diagnosis not present

## 2024-10-31 DIAGNOSIS — E1122 Type 2 diabetes mellitus with diabetic chronic kidney disease: Secondary | ICD-10-CM | POA: Diagnosis not present

## 2024-11-06 DIAGNOSIS — N182 Chronic kidney disease, stage 2 (mild): Secondary | ICD-10-CM | POA: Diagnosis not present

## 2024-11-06 DIAGNOSIS — F3340 Major depressive disorder, recurrent, in remission, unspecified: Secondary | ICD-10-CM | POA: Diagnosis not present

## 2024-11-06 DIAGNOSIS — I10 Essential (primary) hypertension: Secondary | ICD-10-CM | POA: Diagnosis not present

## 2024-11-06 DIAGNOSIS — E782 Mixed hyperlipidemia: Secondary | ICD-10-CM | POA: Diagnosis not present
